# Patient Record
Sex: Female | Born: 1995 | Race: Black or African American | Hispanic: No | Marital: Single | Smoking: Former smoker
Health system: Southern US, Community
[De-identification: ages and names within clinical notes are randomized; demographics above are authoritative.]

## PROBLEM LIST (undated history)

## (undated) ENCOUNTER — Inpatient Hospital Stay: Payer: Self-pay

## (undated) DIAGNOSIS — R87629 Unspecified abnormal cytological findings in specimens from vagina: Secondary | ICD-10-CM

## (undated) DIAGNOSIS — D649 Anemia, unspecified: Secondary | ICD-10-CM

## (undated) DIAGNOSIS — F909 Attention-deficit hyperactivity disorder, unspecified type: Secondary | ICD-10-CM

## (undated) DIAGNOSIS — D573 Sickle-cell trait: Secondary | ICD-10-CM

## (undated) DIAGNOSIS — Z91410 Personal history of adult physical and sexual abuse: Secondary | ICD-10-CM

## (undated) DIAGNOSIS — Z789 Other specified health status: Secondary | ICD-10-CM

## (undated) DIAGNOSIS — Z8742 Personal history of other diseases of the female genital tract: Secondary | ICD-10-CM

## (undated) HISTORY — DX: Anemia, unspecified: D64.9

## (undated) HISTORY — DX: Unspecified abnormal cytological findings in specimens from vagina: R87.629

## (undated) HISTORY — PX: OTHER SURGICAL HISTORY: SHX169

## (undated) HISTORY — DX: Personal history of adult physical and sexual abuse: Z91.410

## (undated) HISTORY — DX: Attention-deficit hyperactivity disorder, unspecified type: F90.9

## (undated) HISTORY — DX: Personal history of other diseases of the female genital tract: Z87.42

---
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
## 2014-06-11 ENCOUNTER — Encounter: Payer: Self-pay | Admitting: *Deleted

## 2014-06-11 ENCOUNTER — Emergency Department
Admission: EM | Admit: 2014-06-11 | Discharge: 2014-06-11 | Disposition: A | Discharge status: Home / Self Care | Payer: Self-pay | Attending: Emergency Medicine | Admitting: Emergency Medicine

## 2014-06-11 DIAGNOSIS — Z720 Tobacco use: Secondary | ICD-10-CM | POA: Insufficient documentation

## 2014-06-11 DIAGNOSIS — W57XXXA Bitten or stung by nonvenomous insect and other nonvenomous arthropods, initial encounter: Secondary | ICD-10-CM | POA: Insufficient documentation

## 2014-06-11 DIAGNOSIS — Y9389 Activity, other specified: Secondary | ICD-10-CM | POA: Insufficient documentation

## 2014-06-11 DIAGNOSIS — Y9289 Other specified places as the place of occurrence of the external cause: Secondary | ICD-10-CM | POA: Insufficient documentation

## 2014-06-11 DIAGNOSIS — Y998 Other external cause status: Secondary | ICD-10-CM | POA: Insufficient documentation

## 2014-06-11 DIAGNOSIS — S90464A Insect bite (nonvenomous), right lesser toe(s), initial encounter: Secondary | ICD-10-CM | POA: Insufficient documentation

## 2014-06-11 NOTE — ED Provider Notes (Signed)
[facility] Emergency Department Provider Note  ____________________________________________  Time seen: 7:40 AM  I have reviewed the triage vital signs and the nursing notes.   HISTORY  Chief Complaint Foot Swelling     HPI Rose Porter is a 19 y.o. female who complains of intermittent swelling of right foot for one month which she attributes to a bug bite on her right third toe. She is not currently having any swelling nor pain but she believes that she may have a blood clot in her foot. No history of blood clots and again no physical complaints today.    History reviewed. No pertinent past medical history.  There are no active problems to display for this patient.   History reviewed. No pertinent past surgical history.  No current outpatient prescriptions on file.  Allergies Review of patient's allergies indicates no known allergies.  History reviewed. No pertinent family history.  Social History History  Substance Use Topics  . Smoking status: Current Every Day Smoker -- 1.00 packs/day    Types: Cigarettes  . Smokeless tobacco: Never Used  . Alcohol Use: Yes     Comment: daily, 1 pint of liquor    Review of Systems  Constitutional: Negative for fever.  Gastrointestinal: Negative for abdominal pain, vomiting and diarrhea. Genitourinary: Negative for dysuria. Musculoskeletal: Negative for back pain. Skin: Negative for rash. Neurological: Negative for headaches, focal weakness or numbness.   10-point ROS otherwise negative.  ____________________________________________   PHYSICAL EXAM:  VITAL SIGNS: ED Triage Vitals  Enc Vitals Group     BP 06/11/14 0639 124/74 mmHg     Pulse Rate 06/11/14 0639 67     Resp 06/11/14 0639 16     Temp 06/11/14 0639 98.1 F (36.7 C)     Temp Source 06/11/14 0639 Oral     SpO2 06/11/14 0639 100 %     Weight 06/11/14 0639 108 lb (48.988 kg)     Height 06/11/14 0639 5\' 1"  (1.549 m)   Head Cir --      Peak Flow --      Pain Score --      Pain Loc --      Pain Edu? --      Excl. in GC? --      Constitutional: Alert and oriented. Well appearing and in no distress.  Hematological/Lymphatic/Immunilogical: No cervical lymphadenopathy.  Genitourinary: deferred Musculoskeletal: Nontender with normal range of motion in all extremities. No joint effusions.  No lower extremity tenderness nor edema. Absolutely no swelling of feet. Normal pulses in both feet both feet are warm and well perfused. Neurologic:  Normal speech and language. No gross focal neurologic deficits are appreciated. Speech is normal.  Skin:  Skin is warm, dry and intact. No rash noted. Psychiatric: Mood and affect are normal. Speech and behavior are normal. Patient exhibits appropriate insight and judgment.  ____________________________________________    LABS (pertinent positives/negatives)  None  ____________________________________________   EKG  None  ____________________________________________    RADIOLOGY  None  ____________________________________________   PROCEDURES  Procedure(s) performed: None  Critical Care performed: None  ____________________________________________   INITIAL IMPRESSION / ASSESSMENT AND PLAN / ED COURSE  Pertinent labs & imaging results that were available during my care of the patient were reviewed by me and considered in my medical decision making (see chart for details).  No swelling of foot no areas of erythema or infection, no fungus noted. Foot is warm and well perfused. No calf pain or  swelling. Essentially benign exam. Reassurance provided. Recommend follow-up with PCP.  ____________________________________________   FINAL CLINICAL IMPRESSION(S) / ED DIAGNOSES  Final diagnoses:  Insect bite     Jene Everyobert Dyke Weible, MD 06/11/14 [phone]

## 2014-06-11 NOTE — ED Notes (Signed)
NAD noted at time of D/C. Pt denies questions or concerns. Pt ambulatory to the lobby at this time.  

## 2014-06-11 NOTE — ED Notes (Signed)
Pt c/o worsening condition of R foot. Pt endorses several year hx of R foot swelling. Pt states worsening swelling and itching of R foot starting x 1 week ago. Pt's foot is presently not swollen.

## 2014-09-05 ENCOUNTER — Emergency Department
Admission: EM | Admit: 2014-09-05 | Discharge: 2014-09-05 | Disposition: A | Discharge status: Home / Self Care | Payer: Self-pay | Attending: Emergency Medicine | Admitting: Emergency Medicine

## 2014-09-05 ENCOUNTER — Encounter: Payer: Self-pay | Admitting: Emergency Medicine

## 2014-09-05 DIAGNOSIS — B349 Viral infection, unspecified: Secondary | ICD-10-CM | POA: Insufficient documentation

## 2014-09-05 DIAGNOSIS — J029 Acute pharyngitis, unspecified: Secondary | ICD-10-CM | POA: Insufficient documentation

## 2014-09-05 DIAGNOSIS — Z720 Tobacco use: Secondary | ICD-10-CM | POA: Insufficient documentation

## 2014-09-05 LAB — POCT RAPID STREP A: STREPTOCOCCUS, GROUP A SCREEN (DIRECT): NEGATIVE

## 2014-09-05 MED ORDER — MAGIC MOUTHWASH
10.0000 mL | Freq: Once | ORAL | Status: DC
  Filled 2014-09-05 (×2): qty 10

## 2014-09-05 MED ORDER — PSEUDOEPH-BROMPHEN-DM 30-2-10 MG/5ML PO SYRP: 5.0000 mL | ORAL_SOLUTION | Freq: Four times a day (QID) | ORAL | Status: DC | PRN

## 2014-09-05 MED ORDER — IBUPROFEN 800 MG PO TABS
800.0000 mg | ORAL_TABLET | Freq: Once | ORAL | Status: AC
  Administered 2014-09-05: 800 mg via ORAL
  Filled 2014-09-05: qty 1

## 2014-09-05 MED ORDER — MAGIC MOUTHWASH W/LIDOCAINE: 5.0000 mL | Freq: Four times a day (QID) | ORAL | Status: DC

## 2014-09-05 NOTE — ED Provider Notes (Signed)
[facility] Emergency Department Provider Note  ____________________________________________  Time seen: Approximately 1:42 PM  I have reviewed the triage vital signs and the nursing notes.   HISTORY  Chief Complaint Fever and Sore Throat    HPI Rose Porter is a 19 y.o. female patient complaining of sore throat headache and fever   History reviewed. No pertinent past medical history.  There are no active problems to display for this patient.   History reviewed. No pertinent past surgical history.  Current Outpatient Rx  Name  Route  Sig  Dispense  Refill  . Alum & Mag Hydroxide-Simeth (MAGIC MOUTHWASH W/LIDOCAINE) SOLN   Oral   Take 5 mLs by mouth 4 (four) times daily.   100 mL   0   . brompheniramine-pseudoephedrine-DM 30-2-10 MG/5ML syrup   Oral   Take 5 mLs by mouth 4 (four) times daily as needed.   120 mL   0     Allergies Review of patient's allergies indicates no known allergies.  History reviewed. No pertinent family history.  Social History History  Substance Use Topics  . Smoking status: Current Every Day Smoker -- 1.00 packs/day    Types: Cigarettes  . Smokeless tobacco: Never Used  . Alcohol Use: Yes     Comment: daily, 1 pint of liquor    Review of Systems Constitutional: No fever/chills Eyes: No visual changes. ENT: No sore throat. Cardiovascular: Denies chest pain. Respiratory: Denies shortness of breath. Gastrointestinal: No abdominal pain.  No nausea, no vomiting.  No diarrhea.  No constipation. Genitourinary: Negative for dysuria. Musculoskeletal: Negative for back pain. Skin: Negative for rash. Neurological: Negative for headaches, focal weakness or numbness. 10-point ROS otherwise negative.  ____________________________________________   PHYSICAL EXAM:  VITAL SIGNS: ED Triage Vitals  Enc Vitals Group     BP 09/05/14 1255 122/77 mmHg     Pulse Rate 09/05/14 1255 110     Resp 09/05/14 1255  20     Temp 09/05/14 1255 100.1 F (37.8 C)     Temp Source 09/05/14 1255 Oral     SpO2 09/05/14 1255 97 %     Weight 09/05/14 1255 110 lb (49.896 kg)     Height 09/05/14 1255  (1.549 m)     Head Cir --      Peak Flow --      Pain Score 09/05/14 1256 7     Pain Loc --      Pain Edu? --      Excl. in GC? --     Constitutional: Alert and oriented. Well appearing and in no acute distress. Eyes: Conjunctivae are normal. PERRL. EOMI. Head: Atraumatic. Nose: No congestion/rhinnorhea. Mouth/Throat: Mucous membranes are moist.  Oropharynx non-erythematous. Neck: No stridor.  No cervical spine tenderness to palpation. Hematological/Lymphatic/Immunilogical: No cervical lymphadenopathy. Cardiovascular: Normal rate, regular rhythm. Grossly normal heart sounds.  Good peripheral circulation. Respiratory: Normal respiratory effort.  No retractions. Lungs CTAB. Gastrointestinal: Soft and nontender. No distention. No abdominal bruits. No CVA tenderness. Musculoskeletal: No lower extremity tenderness nor edema.  No joint effusions. Neurologic:  Normal speech and language. No gross focal neurologic deficits are appreciated. No gait instability. Skin:  Skin is warm, dry and intact. No rash noted. Psychiatric: Mood and affect are normal. Speech and behavior are normal.  ____________________________________________   LABS (all labs ordered are listed, but only abnormal results are displayed)  Labs Reviewed  POCT RAPID STREP A   ____________________________________________  EKG   ____________________________________________  RADIOLOGY   ____________________________________________   PROCEDURES  Procedure(s) performed: None  Critical Care performed: No  ____________________________________________   INITIAL IMPRESSION / ASSESSMENT AND PLAN / ED COURSE  Pertinent labs & imaging results that were available during my care of the patient were reviewed by me and considered in my  medical decision making (see chart for details).  Viral pharyngitis secondary to postnasal drainage. Patient given a prescription for ibuprofen, Magic mouthwash and also to take Phenergan DM as directed. [facility] condition condition worsens. ____________________________________________   FINAL CLINICAL IMPRESSION(S) / ED DIAGNOSES  Final diagnoses:  Pharyngitis with viral syndrome      Joni Reining, PA-C 09/05/14 1446  Phineas Semen, MD 09/05/14 [phone]

## 2014-09-05 NOTE — ED Notes (Signed)
Pt to ed with c/o sore throat and fever since last night.

## 2014-09-05 NOTE — ED Notes (Signed)
C/o sorethroat, headache, fever, back of throat slightly red

## 2015-01-28 DIAGNOSIS — Z8742 Personal history of other diseases of the female genital tract: Secondary | ICD-10-CM

## 2015-01-28 DIAGNOSIS — Z91410 Personal history of adult physical and sexual abuse: Secondary | ICD-10-CM

## 2015-01-28 HISTORY — DX: Personal history of other diseases of the female genital tract: Z87.42

## 2015-01-28 HISTORY — DX: Personal history of adult physical and sexual abuse: Z91.410

## 2015-04-11 LAB — OB RESULTS CONSOLE GBS: GBS: NEGATIVE

## 2015-04-11 LAB — OB RESULTS CONSOLE RPR: RPR: NONREACTIVE

## 2015-04-11 LAB — OB RESULTS CONSOLE GC/CHLAMYDIA
Chlamydia: NEGATIVE
Gonorrhea: NEGATIVE

## 2015-10-04 ENCOUNTER — Other Ambulatory Visit: Payer: Self-pay | Admitting: Advanced Practice Midwife

## 2015-10-04 DIAGNOSIS — Z369 Encounter for antenatal screening, unspecified: Secondary | ICD-10-CM

## 2015-10-04 LAB — OB RESULTS CONSOLE HEPATITIS B SURFACE ANTIGEN: Hepatitis B Surface Ag: NEGATIVE

## 2015-10-04 LAB — OB RESULTS CONSOLE HIV ANTIBODY (ROUTINE TESTING): HIV: NONREACTIVE

## 2015-10-25 ENCOUNTER — Encounter: Payer: Self-pay | Admitting: *Deleted

## 2015-10-25 ENCOUNTER — Ambulatory Visit (HOSPITAL_BASED_OUTPATIENT_CLINIC_OR_DEPARTMENT_OTHER)
Admission: RE | Admit: 2015-10-25 | Discharge: 2015-10-25 | Disposition: A | Discharge status: Home / Self Care | Payer: Medicaid Other | Source: Ambulatory Visit | Attending: Obstetrics & Gynecology | Admitting: Obstetrics & Gynecology

## 2015-10-25 ENCOUNTER — Ambulatory Visit
Admission: RE | Admit: 2015-10-25 | Discharge: 2015-10-25 | Disposition: A | Discharge status: Home / Self Care | Payer: Medicaid Other | Source: Ambulatory Visit | Attending: Obstetrics & Gynecology | Admitting: Obstetrics & Gynecology

## 2015-10-25 DIAGNOSIS — Z3A12 12 weeks gestation of pregnancy: Secondary | ICD-10-CM | POA: Diagnosis not present

## 2015-10-25 DIAGNOSIS — O99331 Smoking (tobacco) complicating pregnancy, first trimester: Secondary | ICD-10-CM | POA: Insufficient documentation

## 2015-10-25 DIAGNOSIS — F909 Attention-deficit hyperactivity disorder, unspecified type: Secondary | ICD-10-CM | POA: Diagnosis not present

## 2015-10-25 DIAGNOSIS — O0991 Supervision of high risk pregnancy, unspecified, first trimester: Secondary | ICD-10-CM | POA: Diagnosis not present

## 2015-10-25 DIAGNOSIS — Z369 Encounter for antenatal screening, unspecified: Secondary | ICD-10-CM

## 2015-10-25 DIAGNOSIS — Z36 Encounter for antenatal screening of mother: Secondary | ICD-10-CM

## 2015-10-25 DIAGNOSIS — O0990 Supervision of high risk pregnancy, unspecified, unspecified trimester: Secondary | ICD-10-CM

## 2015-10-25 DIAGNOSIS — Z91410 Personal history of adult physical and sexual abuse: Secondary | ICD-10-CM | POA: Insufficient documentation

## 2015-10-25 DIAGNOSIS — Z8659 Personal history of other mental and behavioral disorders: Secondary | ICD-10-CM | POA: Diagnosis not present

## 2015-10-25 HISTORY — DX: Sickle-cell trait: D57.3

## 2015-10-25 NOTE — Progress Notes (Signed)
Rose Porter, [place] A, MD

## 2015-10-25 NOTE — Progress Notes (Signed)
Rose Porter Length of Consultation: 45 minutes  Rose Porter  was referred to [facility] for genetic counseling to review prenatal screening and testing options.  This note summarizes the information we discussed.    We offered the following routine screening tests for this pregnancy:  First trimester screening, which includes nuchal translucency ultrasound screen and first trimester maternal serum marker screening.  The nuchal translucency has approximately an 80% detection rate for Down syndrome and can be positive for other chromosome abnormalities as well as congenital heart defects.  When combined with a maternal serum marker screening, the detection rate is up to 90% for Down syndrome and up to 97% for trisomy 18.     Maternal serum marker screening, a blood test that measures pregnancy proteins, can provide risk assessments for Down syndrome, trisomy 18, and open neural tube defects (spina bifida, anencephaly). Because it does not directly examine the fetus, it cannot positively diagnose or rule out these problems.  Targeted ultrasound uses high frequency sound waves to create an image of the developing fetus.  An ultrasound is often recommended as a routine means of evaluating the pregnancy.  It is also used to screen for fetal anatomy problems (for example, a heart defect) that might be suggestive of a chromosomal or other abnormality.   Should these screening tests indicate an increased concern, then the following additional testing options would be offered:  The chorionic villus sampling procedure is available for first trimester chromosome analysis.  This involves the withdrawal of a small amount of chorionic villi (tissue from the developing placenta).  Risk of pregnancy loss is estimated to be approximately 1 in 200 to 1 in 100 (0.5 to 1%).  There is approximately a 1% (1 in 100) chance that the CVS chromosome results will be unclear.  Chorionic villi cannot be  tested for neural tube defects.     Amniocentesis involves the removal of a small amount of amniotic fluid from the sac surrounding the fetus with the use of a thin needle inserted through the maternal abdomen and uterus.  Ultrasound guidance is used throughout the procedure.  Fetal cells from amniotic fluid are directly evaluated and > 99.5% of chromosome problems and > 98% of open neural tube defects can be detected. This procedure is generally performed after the 15th week of pregnancy.  The main risks to this procedure include complications leading to miscarriage in less than 1 in 200 cases (0.5%).  As another option for information if the pregnancy is suspected to be an an increased chance for certain chromosome conditions, we also reviewed the availability of cell free fetal DNA testing from maternal blood to determine whether or not the baby may have either Down syndrome, trisomy 77, or trisomy 50.  This test utilizes a maternal blood sample and DNA sequencing technology to isolate circulating cell free fetal DNA from maternal plasma.  The fetal DNA can then be analyzed for DNA sequences that are derived from the three most common chromosomes involved in aneuploidy, chromosomes 13, 18, and 21.  If the overall amount of DNA is greater than the expected level for any of these chromosomes, aneuploidy is suspected.  While we do not consider it a replacement for invasive testing and karyotype analysis, a negative result from this testing would be reassuring, though not a guarantee of a normal chromosome complement for the baby.  An abnormal result is certainly suggestive of an abnormal chromosome complement, though we would still recommend CVS  or amniocentesis to confirm any findings from this testing.  Cystic Fibrosis and Spinal Muscular Atrophy (SMA) screening were also discussed with the patient. Both conditions are recessive, which means that both parents must be carriers in order to have a child with  the disease.  Cystic fibrosis (CF) is one of the most common genetic conditions in persons of Caucasian ancestry.  This condition occurs in approximately 1 in 2,500 Caucasian persons and results in thickened secretions in the lungs, digestive, and reproductive systems.  For a baby to be at risk for having CF, both of the parents must be carriers for this condition.  Approximately 1 in 525 Caucasian persons is a carrier for CF.  Current carrier testing looks for the most common mutations in the gene for CF and can detect approximately 90% of carriers in the Caucasian population.  This means that the carrier screening can greatly reduce, but cannot eliminate, the chance for an individual to have a child with CF.  If an individual is found to be a carrier for CF, then carrier testing would be available for the partner. As part of [place]'s newborn screening profile, all babies born in the state of [place] will have a two-tier screening process.  Specimens are first tested to determine the concentration of immunoreactive trypsinogen (IRT).  The top 5% of specimens with the highest IRT values then undergo DNA testing using a panel of over 40 common CF mutations. SMA is a neurodegenerative disorder that leads to atrophy of skeletal muscle and overall weakness.  This condition is also more prevalent in the Caucasian population, with 1 in 40-1 in 60 persons being a carrier and 1 in 6,000-1 in 10,000 children being affected.  There are multiple forms of the disease, with some causing death in infancy to other forms with survival into adulthood.  The genetics of SMA is complex, but carrier screening can detect up to 95% of carriers in the Caucasian population.  Similar to CF, a negative result can greatly reduce, but cannot eliminate, the chance to have a child with SMA.  We obtained a detailed family history and pregnancy history.  The patient stated that she has sickle cell trait.  We also discussed that 1 in  10 individuals of African American ancestry have sickle cell trait.  We reviewed some general information about sickle cell disease and trait.  Sickle cell anemia is caused by a change in the hemoglobin.  Hemoglobin is the substance in red blood cells that carries oxygen.  When there is a change in the structure of the hemoglobin, there are problems in the way the blood carries oxygen.  People with sickle cell disease are at an increased risk for infections, stroke, damage to certain organs, painful crises and other medical complications.  Like cystic fibrosis, sickle cell disease is an autosomal recessive condition.  Ms. Roseanne RenoStewart stated that she does not know if the donor (or father of the baby) is a carrier and it is unlikely that he would be tested.  We discussed the option of free testing for sickle cell through the  Sickle Cell program and offered at every [facility] [place] [facility]  [place] health department.  Given the information provided today, there is a 1 in 40 chance of this fetus having sickle cell disease.  The remainder of the family history was reported to be unremarkable for birth defects, mental retardation, recurrent pregnancy loss or known chromosome abnormalities.  Consanguinity is denied.  Ms. Roseanne RenoStewart reported no pregnancy complications.  She is smoking approximately one cigarrette a day.  We encouraged her to reduce her tobacco intake altogether.  She also reported that she had several alcoholic drinks on a few occassions before she knew she was pregnant.  Based on reported amount, we would NOTsuspect that the fetus is at an increased risk for fetal alcohol syndrome.  After consideration of the options, Ms. Mcculley elected to proceed with first trimester screening and carrier screening for SMA.  An ultrasound was performed at the time of the visit.  The gestational age was consistent with 12 weeks.  Fetal anatomy could not be assessed due to early gestational age.  Please refer to the ultrasound  report for details of that study.  Ms. Masse was encouraged to call with questions or concerns.  We can be contacted at [phone].  TESTS ORDERED:   First Trimester screening    Carrier Screening, SMA

## 2015-10-26 LAB — MISC LABCORP TEST (SEND OUT): Labcorp test code: 450010

## 2015-10-29 ENCOUNTER — Telehealth: Payer: Self-pay | Admitting: Obstetrics and Gynecology

## 2015-10-29 NOTE — Telephone Encounter (Signed)
   Ms. Rose Porter elected to undergo First Trimester screening as a part of her routine prenatal care on 10/25/2015.  To review, first trimester screening, includes nuchal translucency ultrasound screen and/or first trimester maternal serum marker screening.  The nuchal translucency has approximately an 80% detection rate for Down syndrome and can be positive for other chromosome abnormalities as well as heart defects.  When combined with a maternal serum marker screening, the detection rate is up to 90% for Down syndrome and up to 97% for trisomy 13 and 18.     The results of the First Trimester Nuchal Translucency and Biochemical Screening were within normal range for aneuploidy.  The risk for Down syndrome is now estimated to be less than 1 in 10,000.  The risk for Trisomy 13/18 is also esimated to be less than 1 in 10,000.  Should more definitive information be desired, we would offer amniocentesis.  Because we do not yet know the effectiveness of combined first and second trimester screening, we do not recommend a maternal serum screen to assess the chance for chromosome conditions.  However, if screening for neural tube defects is desired, maternal serum screening for AFP only can be performed between 15 and [redacted] weeks gestation.    Of note, the PAPP-A level was at the 5th percentile.  PAPP-A results at or below the 5th percentile have been associated with and increased chance for adverse obstetrical outcomes, including preeclampsia.  For this reason, a third trimester ultrasound for growth is recommended.    Rose Andersoneborah F. Anzel Kearse, MS, CGC

## 2015-11-03 ENCOUNTER — Encounter: Payer: Self-pay | Admitting: *Deleted

## 2015-11-03 ENCOUNTER — Emergency Department
Admission: EM | Admit: 2015-11-03 | Discharge: 2015-11-03 | Disposition: A | Discharge status: Home / Self Care | Payer: Medicaid Other | Attending: Student | Admitting: Student

## 2015-11-03 DIAGNOSIS — O99331 Smoking (tobacco) complicating pregnancy, first trimester: Secondary | ICD-10-CM | POA: Diagnosis not present

## 2015-11-03 DIAGNOSIS — O4690 Antepartum hemorrhage, unspecified, unspecified trimester: Secondary | ICD-10-CM

## 2015-11-03 DIAGNOSIS — Z3A12 12 weeks gestation of pregnancy: Secondary | ICD-10-CM | POA: Insufficient documentation

## 2015-11-03 DIAGNOSIS — O200 Threatened abortion: Secondary | ICD-10-CM | POA: Diagnosis not present

## 2015-11-03 DIAGNOSIS — F17210 Nicotine dependence, cigarettes, uncomplicated: Secondary | ICD-10-CM | POA: Diagnosis not present

## 2015-11-03 DIAGNOSIS — O209 Hemorrhage in early pregnancy, unspecified: Secondary | ICD-10-CM | POA: Diagnosis present

## 2015-11-03 LAB — URINALYSIS COMPLETE WITH MICROSCOPIC (ARMC ONLY)
BILIRUBIN URINE: NEGATIVE
Bacteria, UA: NONE SEEN
GLUCOSE, UA: NEGATIVE mg/dL
HGB URINE DIPSTICK: NEGATIVE
KETONES UR: NEGATIVE mg/dL
LEUKOCYTES UA: NEGATIVE
NITRITE: NEGATIVE
Protein, ur: NEGATIVE mg/dL
SPECIFIC GRAVITY, URINE: 1.018 (ref 1.005–1.030)
pH: 5 (ref 5.0–8.0)

## 2015-11-03 LAB — CBC
HCT: 37.9 % (ref 35.0–47.0)
Hemoglobin: 12.8 g/dL (ref 12.0–16.0)
MCH: 30.7 pg (ref 26.0–34.0)
MCHC: 33.7 g/dL (ref 32.0–36.0)
MCV: 91.1 fL (ref 80.0–100.0)
PLATELETS: 204 10*3/uL (ref 150–440)
RBC: 4.16 MIL/uL (ref 3.80–5.20)
RDW: 12.5 % (ref 11.5–14.5)
WBC: 14.1 10*3/uL — AB (ref 3.6–11.0)

## 2015-11-03 LAB — COMPREHENSIVE METABOLIC PANEL
ALK PHOS: 52 U/L (ref 38–126)
ALT: 14 U/L (ref 14–54)
ANION GAP: 5 (ref 5–15)
AST: 18 U/L (ref 15–41)
Albumin: 3.7 g/dL (ref 3.5–5.0)
BILIRUBIN TOTAL: 0.2 mg/dL — AB (ref 0.3–1.2)
BUN: 8 mg/dL (ref 6–20)
CO2: 24 mmol/L (ref 22–32)
Calcium: 9.2 mg/dL (ref 8.9–10.3)
Chloride: 105 mmol/L (ref 101–111)
Creatinine, Ser: 0.67 mg/dL (ref 0.44–1.00)
Glucose, Bld: 91 mg/dL (ref 65–99)
Potassium: 3.4 mmol/L — ABNORMAL LOW (ref 3.5–5.1)
Sodium: 134 mmol/L — ABNORMAL LOW (ref 135–145)
Total Protein: 7.3 g/dL (ref 6.5–8.1)

## 2015-11-03 LAB — WET PREP, GENITAL
Sperm: NONE SEEN
Trich, Wet Prep: NONE SEEN
YEAST WET PREP: NONE SEEN

## 2015-11-03 LAB — CHLAMYDIA/NGC RT PCR (ARMC ONLY)
CHLAMYDIA TR: NOT DETECTED
N GONORRHOEAE: NOT DETECTED

## 2015-11-03 LAB — HCG, QUANTITATIVE, PREGNANCY: hCG, Beta Chain, Quant, S: 80694 m[IU]/mL — ABNORMAL HIGH (ref <5)

## 2015-11-03 LAB — ABO/RH: ABO/RH(D): B POS

## 2015-11-03 NOTE — ED Notes (Signed)
Reviewed d/c instructions and follow-up care with pt. Pt verbalized understanding 

## 2015-11-03 NOTE — ED Provider Notes (Addendum)
[facility] Emergency Department Provider Note   ____________________________________________   First MD Initiated Contact with Patient 11/03/15 1842     (approximate)  I have reviewed the triage vital signs and the nursing notes.   HISTORY  Chief Complaint Vaginal Bleeding    HPI Rose Porter is a 20 y.o. female with sickle cell trait, no other chronic medical problems, G1P0 at approximately 12 weeks estimated gestational age by first trimester ultrasound who presents for evaluation of light vaginal spotting this afternoon as well as mild lower abdominal cramping, gradual onset, mild, constant, no modifying factors. She reports she noted blood from her vagina but "only when I wiped". She denies any nausea vomiting diarrhea, fevers or chills. No chest pain or difficulty breathing. She last had intercourse yesterday evening. She was treated at the beginning of this pregnancy for gonorrhea and chlamydia. She is followed by [facility]. She denies any abnormal vaginal discharge.   Past Medical History:  Diagnosis Date  . Sickle cell trait [facility])     Patient Active Problem List   Diagnosis Date Noted  . First trimester screening 10/25/2015    History reviewed. No pertinent surgical history.  Prior to Admission medications   Medication Sig Start Date End Date Taking? Authorizing Provider  Alum & Mag Hydroxide-Simeth (MAGIC MOUTHWASH W/LIDOCAINE) SOLN Take 5 mLs by mouth 4 (four) times daily. Patient not taking: Reported on 10/25/2015 09/05/14   Joni Reining, PA-C  brompheniramine-pseudoephedrine-DM 30-2-10 MG/5ML syrup Take 5 mLs by mouth 4 (four) times daily as needed. Patient not taking: Reported on 10/25/2015 09/05/14   Joni Reining, PA-C    Allergies Review of patient's allergies indicates no known allergies.  History reviewed. No pertinent family history.  Social History Social History  Substance Use Topics  . Smoking status: Current  Every Day Smoker    Packs/day: 1.00    Types: Cigarettes  . Smokeless tobacco: Never Used  . Alcohol use Yes     Comment: daily, 1 pint of liquor    Review of Systems Constitutional: No fever/chills Eyes: No visual changes. ENT: No sore throat. Cardiovascular: Denies chest pain. Respiratory: Denies shortness of breath. Gastrointestinal: + abdominal cramping.  No nausea, no vomiting.  No diarrhea.  No constipation. Genitourinary: Negative for dysuria. Musculoskeletal: Negative for back pain. Skin: Negative for rash. Neurological: Negative for headaches, focal weakness or numbness.  10-point ROS otherwise negative.  ____________________________________________   PHYSICAL EXAM:  Vitals:   11/03/15 1709 11/03/15 2028  BP: 103/64 103/72  Pulse: 64 68  Resp: 18 16  Temp: 98.1 F (36.7 C) 97.6 F (36.4 C)  TempSrc: Oral Oral  SpO2: 100% 98%  Weight: 110 lb (49.9 kg)   Height: 5\' 1"  (1.549 m)     VITAL SIGNS: ED Triage Vitals [11/03/15 1709]  Enc Vitals Group     BP 103/64     Pulse Rate 64     Resp 18     Temp 98.1 F (36.7 C)     Temp Source Oral     SpO2 100 %     Weight 110 lb (49.9 kg)     Height 5\' 1"  (1.549 m)     Head Circumference      Peak Flow      Pain Score      Pain Loc      Pain Edu?      Excl. in GC?     Constitutional: Alert and oriented. Well appearing  and in no acute distress. Eyes: Conjunctivae are normal. PERRL. EOMI. Head: Atraumatic. Nose: No congestion/rhinnorhea. Mouth/Throat: Mucous membranes are moist.  Oropharynx non-erythematous. Neck: No stridor.   Cardiovascular: Normal rate, regular rhythm. Grossly normal heart sounds.  Good peripheral circulation. Respiratory: Normal respiratory effort.  No retractions. Lungs CTAB. Gastrointestinal: Soft and nontender. No distention. Bowel sounds normal. No CVA tenderness. Pelvic: No blood in the vaginal vault, cervix appears friable, os is closed, small amount of thin whitish yellow  vaginal discharge. No bimanual or cervical motion tenderness.  Musculoskeletal: No lower extremity tenderness nor edema.  No joint effusions. Neurologic:  Normal speech and language. No gross focal neurologic deficits are appreciated. No gait instability. Skin:  Skin is warm, dry and intact. No rash noted. Psychiatric: Mood and affect are normal. Speech and behavior are normal.  ____________________________________________   LABS (all labs ordered are listed, but only abnormal results are displayed)  Labs Reviewed  WET PREP, GENITAL - Abnormal; Notable for the following:       Result Value   Clue Cells Wet Prep HPF POC PRESENT (*)    WBC, Wet Prep HPF POC MODERATE (*)    All other components within normal limits  HCG, QUANTITATIVE, PREGNANCY - Abnormal; Notable for the following:    hCG, Beta Chain, Quant, S 80,694 (*)    All other components within normal limits  CBC - Abnormal; Notable for the following:    WBC 14.1 (*)    All other components within normal limits  URINALYSIS COMPLETEWITH MICROSCOPIC ([facility] ONLY) - Abnormal; Notable for the following:    Color, Urine YELLOW (*)    APPearance CLEAR (*)    Squamous Epithelial / LPF 0-5 (*)    All other components within normal limits  COMPREHENSIVE METABOLIC PANEL - Abnormal; Notable for the following:    Sodium 134 (*)    Potassium 3.4 (*)    Total Bilirubin 0.2 (*)    All other components within normal limits  CHLAMYDIA/NGC RT PCR ([facility] ONLY)  ABO/RH   ____________________________________________  EKG  none ____________________________________________  RADIOLOGY  Limited transabdominal emergency department bedside ultrasound performed by me shows a single live intrauterine pregnancy with fetal heart rate of 188 bpm. ____________________________________________   PROCEDURES  Procedure(s) performed: None  Procedures  Critical Care performed:  No  ____________________________________________   INITIAL IMPRESSION / ASSESSMENT AND PLAN / ED COURSE  Pertinent labs & imaging results that were available during my care of the patient were reviewed by me and considered in my medical decision making (see chart for details).  Rose Porter is a 20 y.o. female with sickle cell trait, no other chronic medical problems, G1P0 at approximately 12 weeks estimated gestational age by first trimester ultrasound who presents for evaluation of light vaginal spotting this afternoon as well as mild lower abdominal cramping. On exam, she is very well-appearing and in no acute distress. Vital signs stable, she is afebrile. She has a benign abdominal exam. Her axis friable but also is closed and there is no active vaginal bleeding. Discussed with this patient that this may represent a threatened miscarriage. CBC shows a nonspecific leukocytosis, white blood cell count is 14.1. Unremarkable CMP. Urinalysis is not consistent with infection. Wet prep with clue cells however patient is asymptomatic, no indication for treatment. Beta hCG was elevated at greater than 80,000, B positive blood type, no indication for RhoGAM. We discussed return precautions, need for close OB/GYN follow-up and she is comfortable with the discharge plan.  DC home. GC chlamydia pending at the time of discharge.  Clinical Course     ____________________________________________   FINAL CLINICAL IMPRESSION(S) / ED DIAGNOSES  Final diagnoses:  Vaginal bleeding in pregnancy  Threatened miscarriage      NEW MEDICATIONS STARTED DURING THIS VISIT:  New Prescriptions   No medications on file     Note:  This document was prepared using Dragon voice recognition software and may include unintentional dictation errors.    Gayla DossEryka A Naarah Borgerding, MD 11/03/15 [id number]    Gayla DossEryka A Kripa Foskey, MD 11/03/15 2032

## 2015-11-03 NOTE — ED Notes (Signed)
Report called to Jenna, RN.

## 2015-11-03 NOTE — ED Triage Notes (Signed)
States vaginal bleeding that began today, states she is approx [redacted] weeks pregnant, denies any clots, pt awake and alert in no acute distress

## 2015-11-08 ENCOUNTER — Telehealth: Payer: Self-pay | Admitting: Obstetrics and Gynecology

## 2015-11-08 NOTE — Telephone Encounter (Signed)
The results of the SMA carrier screening are now available.  SMA is also a recessive genetic condition with variable age of onset and severity caused by mutations in the SMN1 gene.  This carrier testing assesses the number of copies of this gene.  Persons with one copy of the SMN1 gene are carriers, and those with no copies are affected with the condition.  Individuals with two or more copies have a reduced chance to be a carrier.  Not all mutations can be detected with this testing, though it can detect 94.8% of carriers in the Caucasian population and 70.5% of mutations in the African American population.  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    Given these results and the ethnic background of the father of the pregnancy, the chance for him to be a carrier for SMA is 1 in 4972, which means that there is a 1 in 288 chance for this pregnancy to be affected with SMA.  If he chooses to have carrier testing, we can better assess these chances.  If he has negative results on carrier testing, that can decrease the chance that the baby would have the condition.  If he were found to be a carrier, then the chance for this baby to be affected with SMA is 1 in 4 and prenatal diagnosis would be available for this pregnancy through amniocentesis.  Ms. Rose Porter plans to speak with the father of the baby and contact our office to let us know if he would like to come in for testing.  We are happy to order the hemoglobinopathy evaluation for sickle cell testing too, as the patient is a carrier for sickle cell.  He previously declined carrier testing for sickle cell.  We encouraged the patient to call with any questions or concerns as they arise.  We may be reached at [phone].  Cherly Andersoneborah F. Chai Routh, MS, CGC

## 2015-12-06 LAB — OB RESULTS CONSOLE VARICELLA ZOSTER ANTIBODY, IGG: VARICELLA IGG: IMMUNE

## 2015-12-06 LAB — OB RESULTS CONSOLE RUBELLA ANTIBODY, IGM: RUBELLA: IMMUNE

## 2015-12-10 ENCOUNTER — Other Ambulatory Visit: Payer: Self-pay | Admitting: *Deleted

## 2015-12-10 DIAGNOSIS — O99332 Smoking (tobacco) complicating pregnancy, second trimester: Secondary | ICD-10-CM

## 2015-12-13 ENCOUNTER — Ambulatory Visit
Admission: RE | Admit: 2015-12-13 | Discharge: 2015-12-13 | Disposition: A | Discharge status: Home / Self Care | Payer: Medicaid Other | Source: Ambulatory Visit | Attending: Obstetrics and Gynecology | Admitting: Obstetrics and Gynecology

## 2015-12-13 DIAGNOSIS — O280 Abnormal hematological finding on antenatal screening of mother: Secondary | ICD-10-CM

## 2015-12-13 DIAGNOSIS — O09899 Supervision of other high risk pregnancies, unspecified trimester: Secondary | ICD-10-CM | POA: Insufficient documentation

## 2015-12-13 DIAGNOSIS — O99332 Smoking (tobacco) complicating pregnancy, second trimester: Secondary | ICD-10-CM | POA: Diagnosis not present

## 2015-12-13 DIAGNOSIS — Z3A19 19 weeks gestation of pregnancy: Secondary | ICD-10-CM | POA: Insufficient documentation

## 2016-01-28 NOTE — L&D Delivery Note (Addendum)
Delivery Note At 9:52 AM a viable and healthy female "Rose Porter" was delivered via Vaginal, Spontaneous Delivery (Presentation: LOA).  APGAR: 8, 9; weight 6 lb 15.5 oz (3160 g).   Placenta status: spontaneous.  Cord:  3vc with the following complications: none.  Cord pH: 7.23  Anesthesia:  epidural Episiotomy: None Lacerations: 1st degree Suture Repair: 3.0 vicryl Est. Blood Loss (mL):  400  Mom to postpartum.  Baby to Couplet care / Skin to Skin.  21 year old G1 P0 at [redacted]w[redacted]d on day 2 of an induction of labor for suspected fetal growth restriction and known low Pap A at first trimester screening. She received Pitocin, cervical ripening and artificial rupture membranes. She entered the second stage of labor which point she began to push over an intact perineum. She easily delivered the fetal head with a loose nuchal cord that was reduced on the perineum, followed promptly by the anterior shoulder in an LOA position. The baby was passed up to the maternal abdomen, and delayed cord clamping undertaken. He promptly began to cry, with excellent tone. The placenta delivered spontaneously with expression and was intact. A three-vessel cord was noted. Significant atony was noted, with brisk bright red bleeding immediately after delivery. Manual evacuation of the uterus revealed no clots, but atony to the fundus. Postpartum Pitocin was started without immediate resolution. after 1 minute, IM Methergine 0.2 mg was given. The uterus did contract down with bimanual massage, which was continued until her bleeding was minimal. The placenta was examined. The baby was vigorous and crying after a short stent in the warmer, and placed back on the maternal abdomen. cord gases were collected because of the suspected IUGR and minimal variability on fetal monitoring during labor. The placenta will be sent to pathology for the same reason.  The perineum was inspected, and a first-degree laceration along the left side was noted.  There was a skinning laceration in the posterior fourchette, and was repaired with 3-0 Vicryl on an SH needle for excellent reapproximation of the tissue.  The patient tolerated these procedures well, and is stable in the LDR.  [name] 05/03/2016, 10:58 AM

## 2016-02-21 ENCOUNTER — Ambulatory Visit
Admission: RE | Admit: 2016-02-21 | Discharge: 2016-02-21 | Disposition: A | Discharge status: Home / Self Care | Payer: Medicaid Other | Source: Ambulatory Visit | Attending: Obstetrics and Gynecology | Admitting: Obstetrics and Gynecology

## 2016-02-21 DIAGNOSIS — O09899 Supervision of other high risk pregnancies, unspecified trimester: Secondary | ICD-10-CM | POA: Insufficient documentation

## 2016-02-21 DIAGNOSIS — Z3A29 29 weeks gestation of pregnancy: Secondary | ICD-10-CM | POA: Diagnosis not present

## 2016-02-21 DIAGNOSIS — O280 Abnormal hematological finding on antenatal screening of mother: Secondary | ICD-10-CM | POA: Insufficient documentation

## 2016-03-28 ENCOUNTER — Encounter: Payer: Self-pay | Admitting: *Deleted

## 2016-03-28 ENCOUNTER — Inpatient Hospital Stay
Admission: EM | Admit: 2016-03-28 | Discharge: 2016-03-28 | Disposition: A | Discharge status: Home / Self Care | Payer: Medicaid Other | Attending: Obstetrics and Gynecology | Admitting: Obstetrics and Gynecology

## 2016-03-28 DIAGNOSIS — Z3A34 34 weeks gestation of pregnancy: Secondary | ICD-10-CM | POA: Diagnosis present

## 2016-03-28 DIAGNOSIS — O09893 Supervision of other high risk pregnancies, third trimester: Secondary | ICD-10-CM | POA: Insufficient documentation

## 2016-03-28 DIAGNOSIS — O280 Abnormal hematological finding on antenatal screening of mother: Secondary | ICD-10-CM

## 2016-03-28 DIAGNOSIS — O09899 Supervision of other high risk pregnancies, unspecified trimester: Secondary | ICD-10-CM

## 2016-03-28 NOTE — OB Triage Note (Signed)
Patient arrived for a Nonstress test.

## 2016-03-28 NOTE — Progress Notes (Signed)
   Triage visit for NST   Rose Porter is a 21 y.o. G1P0000. She is at 4559w1d gestation. She presents for a scheduled NST.  Indication: High risk pregnancy with Low PAPP-A  S: Resting comfortably. no CTX, no VB. Active fetal movement.   BP 105/61 (BP Location: Right Arm)   Pulse 72   Temp 97.5 F (36.4 C) (Oral)   Resp 16   LMP 08/02/2015 (LMP Unknown)  No results found for this or any previous visit (from the past 48 hour(s)).   Gen: NAD, AAOx3      Abd: FNTTP      Ext: Non-tender, Nonedmeatous    NST  Baseline: 140 bpm Variability: moderate Accelerations present x >2 Decelerations absent Time 20mins  Interpretation: reactive NST, category 1 tracing  A/P:  21 y.o. G1P0000 3759w1d with Low PAPP-A.    Reactive NST, with moderate variability and accelerations, no decels  Fetal Wellbeing: Reassuring  Strict FKC's daily  Growth US today at the office   D/c home stable, precautions reviewed, follow-up as scheduled.     [name], CNM

## 2016-03-28 NOTE — OB Triage Note (Signed)
G1P0 presents at 7161w1d for scheduled NST.

## 2016-03-28 NOTE — Discharge Instructions (Signed)
Drink plenty of fluids, get plenty of rest.  Keep all scheduled appointments.  Call your provider with question or concerns.

## 2016-03-28 NOTE — OB Triage Note (Signed)
[name], CNM notified of reactive NST.  Patient removed from monitors and discharged home.

## 2016-03-28 NOTE — OB Triage Note (Signed)
Pt discharged home.  Discharge instructions,  follow up appointment given to and reviewed with pt.  Pt verbalized understanding. Pt ambulatory.

## 2016-04-07 ENCOUNTER — Observation Stay
Admission: EM | Admit: 2016-04-07 | Discharge: 2016-04-07 | Disposition: A | Discharge status: Home / Self Care | Payer: Medicaid Other | Attending: Obstetrics & Gynecology | Admitting: Obstetrics & Gynecology

## 2016-04-07 DIAGNOSIS — O281 Abnormal biochemical finding on antenatal screening of mother: Principal | ICD-10-CM | POA: Insufficient documentation

## 2016-04-07 DIAGNOSIS — Z79899 Other long term (current) drug therapy: Secondary | ICD-10-CM | POA: Insufficient documentation

## 2016-04-07 DIAGNOSIS — Z87891 Personal history of nicotine dependence: Secondary | ICD-10-CM | POA: Diagnosis not present

## 2016-04-07 DIAGNOSIS — D573 Sickle-cell trait: Secondary | ICD-10-CM | POA: Insufficient documentation

## 2016-04-07 DIAGNOSIS — O99013 Anemia complicating pregnancy, third trimester: Secondary | ICD-10-CM | POA: Insufficient documentation

## 2016-04-07 DIAGNOSIS — O09899 Supervision of other high risk pregnancies, unspecified trimester: Secondary | ICD-10-CM

## 2016-04-07 DIAGNOSIS — Z3A35 35 weeks gestation of pregnancy: Secondary | ICD-10-CM | POA: Diagnosis not present

## 2016-04-07 DIAGNOSIS — Z3490 Encounter for supervision of normal pregnancy, unspecified, unspecified trimester: Secondary | ICD-10-CM

## 2016-04-07 DIAGNOSIS — O280 Abnormal hematological finding on antenatal screening of mother: Secondary | ICD-10-CM

## 2016-04-07 NOTE — OB Triage Note (Signed)
Pt here for NST from office. Pt placed on monitor. Will cont. To monitor.

## 2016-04-07 NOTE — Procedures (Signed)
NST  Patient's last menstrual period was 08/02/2015 . Estimated Date of Delivery: 05/08/16  Baseline: 135 Variability: moderate Accelerations present x >2 Decelerations absent Time 20mins  Interpretation: reactive NST, category 1 tracing  ----- Ranae Plumberhelsea Sathvika Ojo, MD Attending Obstetrician and Gynecologist [facility], Department of OB/GYN [facility]

## 2016-04-07 NOTE — Discharge Instructions (Signed)
Discharge instructions given. Patient here for NST. Reactive NST. Patient discharged home.

## 2016-04-07 NOTE — Discharge Summary (Signed)
Rose GarnetRoshonda D Porter is a 21 y.o. female. She is at 3061w4d gestation. Patient's last menstrual period was 08/02/2015 (lmp unknown). Estimated Date of Delivery: 05/08/16  Chief Complaint: high risk pregnancy  S: Resting comfortably. no CTX, no VB.no LOF,  Active fetal movement.   Patient has low PAPP-A, and is considered high risk for growth restriction and preeclampsia.  Maternal Medical History:   Past Medical History:  Diagnosis Date  . Sickle cell trait (HCC)     History reviewed. No pertinent surgical history.  No Known Allergies  Prior to Admission medications   Medication Sig Start Date End Date Taking? Authorizing Provider  Prenatal Vit-Fe Fumarate-FA (MULTIVITAMIN-PRENATAL) 27-0.8 MG TABS tablet Take 1 tablet by mouth daily at 12 noon.   Yes Historical Provider, MD  Alum & Mag Hydroxide-Simeth (MAGIC MOUTHWASH W/LIDOCAINE) SOLN Take 5 mLs by mouth 4 (four) times daily. Patient not taking: Reported on 12/13/2015 09/05/14   Joni Reiningonald K Smith, PA-C  brompheniramine-pseudoephedrine-DM 30-2-10 MG/5ML syrup Take 5 mLs by mouth 4 (four) times daily as needed. Patient not taking: Reported on 12/13/2015 09/05/14   Joni Reiningonald K Smith, PA-C     [facility]   Social History: She  reports that she has quit smoking. Her smoking use included Cigarettes. She smoked 1.00 pack per day. She has never used smokeless tobacco. She reports that she does not drink alcohol or use drugs.  Family History: family history includes Cancer in her cousin; Diabetes in her maternal grandfather, paternal grandfather, and paternal grandmother; Hypertension in her maternal grandmother. no history of gyn cancers  Review of Systems: A full review of systems was performed and negative except as noted in the HPI.     O:  BP 115/72   Pulse (!) 56   LMP 08/02/2015 (LMP Unknown)  No results found for this or any previous visit (from the past 48 hour(s)).   Constitutional: NAD, AAOx3  HE/ENT:  extraocular movements grossly intact, moist mucous membranes CV: RRR PULM: nl respiratory effort, CTABL     Abd: gravid, non-tender, non-distended, soft      Ext: Non-tender, Nonedmeatous   Psych: mood appropriate, speech normal Pelvic: deferred  FHT: 135 mod + accels no decels - see NST TOCO: quiet    A/P:  21yo G1P0 @ 35.2 with High Risk Pregnancy and Reactive NST   Labor: not present.   Fetal Wellbeing: Reassuring Cat 1 tracing.  D/c home stable, precautions reviewed, follow-up as scheduled.   ----- Ranae Plumberhelsea Ward, MD Attending Obstetrician and Gynecologist [facility], Department of OB[facility]GYN [facility]

## 2016-04-10 LAB — OB RESULTS CONSOLE RPR: RPR: NONREACTIVE

## 2016-04-10 LAB — OB RESULTS CONSOLE GC/CHLAMYDIA
Chlamydia: NEGATIVE
GC PROBE AMP, GENITAL: NEGATIVE

## 2016-04-10 LAB — OB RESULTS CONSOLE GBS: STREP GROUP B AG: NEGATIVE

## 2016-04-17 ENCOUNTER — Inpatient Hospital Stay
Admission: EM | Admit: 2016-04-17 | Discharge: 2016-04-17 | Disposition: A | Discharge status: Home / Self Care | Payer: Medicaid Other | Attending: Obstetrics and Gynecology | Admitting: Obstetrics and Gynecology

## 2016-04-17 ENCOUNTER — Encounter: Payer: Self-pay | Admitting: *Deleted

## 2016-04-17 DIAGNOSIS — O4703 False labor before 37 completed weeks of gestation, third trimester: Secondary | ICD-10-CM | POA: Insufficient documentation

## 2016-04-17 DIAGNOSIS — Z3A37 37 weeks gestation of pregnancy: Secondary | ICD-10-CM | POA: Diagnosis not present

## 2016-04-17 DIAGNOSIS — O280 Abnormal hematological finding on antenatal screening of mother: Secondary | ICD-10-CM

## 2016-04-17 DIAGNOSIS — O09899 Supervision of other high risk pregnancies, unspecified trimester: Secondary | ICD-10-CM

## 2016-04-17 HISTORY — DX: Other specified health status: Z78.9

## 2016-04-17 LAB — CBC
HEMATOCRIT: 34.3 % — AB (ref 35.0–47.0)
HEMOGLOBIN: 11.8 g/dL — AB (ref 12.0–16.0)
MCH: 31.3 pg (ref 26.0–34.0)
MCHC: 34.5 g/dL (ref 32.0–36.0)
MCV: 90.7 fL (ref 80.0–100.0)
Platelets: 208 10*3/uL (ref 150–440)
RBC: 3.78 MIL/uL — ABNORMAL LOW (ref 3.80–5.20)
RDW: 12.6 % (ref 11.5–14.5)
WBC: 13.4 10*3/uL — ABNORMAL HIGH (ref 3.6–11.0)

## 2016-04-17 LAB — URINE DRUG SCREEN, QUALITATIVE (ARMC ONLY)
Amphetamines, Ur Screen: NOT DETECTED
BARBITURATES, UR SCREEN: NOT DETECTED
BENZODIAZEPINE, UR SCRN: NOT DETECTED
COCAINE METABOLITE, UR ~~LOC~~: NOT DETECTED
Cannabinoid 50 Ng, Ur ~~LOC~~: POSITIVE — AB
MDMA (Ecstasy)Ur Screen: NOT DETECTED
METHADONE SCREEN, URINE: NOT DETECTED
OPIATE, UR SCREEN: NOT DETECTED
PHENCYCLIDINE (PCP) UR S: NOT DETECTED
Tricyclic, Ur Screen: NOT DETECTED

## 2016-04-17 LAB — URINALYSIS, ROUTINE W REFLEX MICROSCOPIC
Bilirubin Urine: NEGATIVE
GLUCOSE, UA: NEGATIVE mg/dL
HGB URINE DIPSTICK: NEGATIVE
Ketones, ur: NEGATIVE mg/dL
Nitrite: NEGATIVE
PROTEIN: NEGATIVE mg/dL
Specific Gravity, Urine: 1.005 (ref 1.005–1.030)
pH: 7 (ref 5.0–8.0)

## 2016-04-17 MED ORDER — CALCIUM CARBONATE ANTACID 500 MG PO CHEW: 2.0000 | CHEWABLE_TABLET | ORAL | Status: DC | PRN

## 2016-04-17 MED ORDER — LACTATED RINGERS IV BOLUS (SEPSIS)
500.0000 mL | Freq: Once | INTRAVENOUS | Status: AC
  Administered 2016-04-17: 500 mL via INTRAVENOUS

## 2016-04-17 MED ORDER — ACETAMINOPHEN 325 MG PO TABS
650.0000 mg | ORAL_TABLET | ORAL | Status: DC | PRN
Start: 2016-04-17 — End: 2016-04-17

## 2016-04-17 NOTE — Plan of Care (Signed)
Dr Dalbert Garnetbeasley in to see pt. Unable to check pt due pt c/o painful vaginal exam. [facility] . Pt d/c home with d/c instructions

## 2016-04-17 NOTE — Plan of Care (Signed)
Pt states she is feeling better. Waiting for dr Dalbert Garnetbeasley

## 2016-04-17 NOTE — OB Triage Note (Signed)
Pt reports onset of sharp, stabbing pains on both sides of uterus- reports occurs bilaterally, not necessarily at same time or length, note: states usually lasting approx a minute in length, unable to discern pattern of occurrence. Happening sometimes with fetal movement. Reports that top of uterus tender- started last evening, worsens with baby balling up. Reports baby active. Pt resting quietly, grimacing with fetal movement.

## 2016-04-17 NOTE — OB Triage Provider Note (Signed)
TRIAGE VISIT with NST   Rose Porter is a 21 y.o. G1P0000. She is at 5824w0d gestation, presenting with signs of labor.  Indication: Contractions  S: Resting comfortably. No obvious CTX when watching patient, though +on monitor, no VB. Active fetal movement. Concerned about generalized abdominal pain.  O:  BP 110/66   Pulse 70   Temp 98 F (36.7 C) (Oral)   Resp 16   Ht 5\' 1"  (1.549 m)   Wt 140 lb (63.5 kg)   LMP 08/02/2015 (LMP Unknown)   BMI 26.45 kg/m  No results found for this or any previous visit (from the past 48 hour(s)).   Gen: NAD, AAOx3      Abd: FNTTP      Ext: Non-tender, Nonedmeatous    NST/FHT: 145, mod var, +accels, no decels TOCO: q2-173min SVE:  attempted but not tolerated by patient and I was unable to get an internal os exam  NST: Reactive. See FHT above for particulars.  A/P:  21 y.o. G1P0000 1024w0d with contractions.   Labor: not present.   May have yeast infection. Will take to clinic for appointment that is already scheduled this hour, and will do wet mount.  Fetal Wellbeing: NST reactive Reassuring Cat 1 tracing.  D/c home stable, precautions reviewed, follow-up as scheduled.

## 2016-05-01 ENCOUNTER — Inpatient Hospital Stay
Admission: EM | Admit: 2016-05-01 | Discharge: 2016-05-04 | DRG: 775 | Disposition: A | Discharge status: Home / Self Care | Payer: Medicaid Other | Attending: Obstetrics and Gynecology | Admitting: Obstetrics and Gynecology

## 2016-05-01 DIAGNOSIS — O9902 Anemia complicating childbirth: Secondary | ICD-10-CM | POA: Diagnosis present

## 2016-05-01 DIAGNOSIS — Z3A39 39 weeks gestation of pregnancy: Secondary | ICD-10-CM

## 2016-05-01 DIAGNOSIS — D573 Sickle-cell trait: Secondary | ICD-10-CM | POA: Diagnosis present

## 2016-05-01 DIAGNOSIS — O365930 Maternal care for other known or suspected poor fetal growth, third trimester, not applicable or unspecified: Principal | ICD-10-CM | POA: Diagnosis present

## 2016-05-01 DIAGNOSIS — Z87891 Personal history of nicotine dependence: Secondary | ICD-10-CM

## 2016-05-01 DIAGNOSIS — Z3490 Encounter for supervision of normal pregnancy, unspecified, unspecified trimester: Secondary | ICD-10-CM

## 2016-05-01 DIAGNOSIS — O09899 Supervision of other high risk pregnancies, unspecified trimester: Secondary | ICD-10-CM

## 2016-05-01 DIAGNOSIS — O280 Abnormal hematological finding on antenatal screening of mother: Secondary | ICD-10-CM

## 2016-05-01 DIAGNOSIS — Z8249 Family history of ischemic heart disease and other diseases of the circulatory system: Secondary | ICD-10-CM

## 2016-05-01 LAB — CHLAMYDIA/NGC RT PCR (ARMC ONLY)
CHLAMYDIA TR: NOT DETECTED
N gonorrhoeae: NOT DETECTED

## 2016-05-01 LAB — TYPE AND SCREEN
ABO/RH(D): B POS
Antibody Screen: NEGATIVE

## 2016-05-01 LAB — URINE DRUG SCREEN, QUALITATIVE (ARMC ONLY)
Amphetamines, Ur Screen: NOT DETECTED
BARBITURATES, UR SCREEN: NOT DETECTED
Benzodiazepine, Ur Scrn: NOT DETECTED
CANNABINOID 50 NG, UR ~~LOC~~: POSITIVE — AB
COCAINE METABOLITE, UR ~~LOC~~: NOT DETECTED
MDMA (ECSTASY) UR SCREEN: NOT DETECTED
METHADONE SCREEN, URINE: NOT DETECTED
OPIATE, UR SCREEN: NOT DETECTED
Phencyclidine (PCP) Ur S: NOT DETECTED
Tricyclic, Ur Screen: NOT DETECTED

## 2016-05-01 LAB — CBC
HEMATOCRIT: 34.8 % — AB (ref 35.0–47.0)
HEMOGLOBIN: 11.9 g/dL — AB (ref 12.0–16.0)
MCH: 30.8 pg (ref 26.0–34.0)
MCHC: 34.1 g/dL (ref 32.0–36.0)
MCV: 90.3 fL (ref 80.0–100.0)
Platelets: 225 10*3/uL (ref 150–440)
RBC: 3.86 MIL/uL (ref 3.80–5.20)
RDW: 12.2 % (ref 11.5–14.5)
WBC: 9.3 10*3/uL (ref 3.6–11.0)

## 2016-05-01 MED ORDER — ONDANSETRON HCL 4 MG/2ML IJ SOLN
4.0000 mg | Freq: Four times a day (QID) | INTRAMUSCULAR | Status: DC | PRN
  Administered 2016-05-03: 4 mg via INTRAVENOUS
  Filled 2016-05-01: qty 2

## 2016-05-01 MED ORDER — ZOLPIDEM TARTRATE 5 MG PO TABS: 5.0000 mg | ORAL_TABLET | Freq: Every evening | ORAL | Status: DC | PRN

## 2016-05-01 MED ORDER — ACETAMINOPHEN 325 MG PO TABS: 650.0000 mg | ORAL_TABLET | ORAL | Status: DC | PRN

## 2016-05-01 MED ORDER — LIDOCAINE HCL (PF) 1 % IJ SOLN: 30.0000 mL | INTRAMUSCULAR | Status: DC | PRN

## 2016-05-01 MED ORDER — SOD CITRATE-CITRIC ACID 500-334 MG/5ML PO SOLN
30.0000 mL | ORAL | Status: DC | PRN
  Filled 2016-05-01: qty 30

## 2016-05-01 MED ORDER — BUTORPHANOL TARTRATE 1 MG/ML IJ SOLN: 2.0000 mg | INTRAMUSCULAR | Status: DC | PRN

## 2016-05-01 MED ORDER — TERBUTALINE SULFATE 1 MG/ML IJ SOLN: 0.2500 mg | Freq: Once | INTRAMUSCULAR | Status: DC | PRN

## 2016-05-01 MED ORDER — LACTATED RINGERS IV SOLN
INTRAVENOUS | Status: DC
  Administered 2016-05-01 – 2016-05-03 (×4): via INTRAVENOUS

## 2016-05-01 MED ORDER — LACTATED RINGERS IV SOLN: 500.0000 mL | INTRAVENOUS | Status: DC | PRN

## 2016-05-01 MED ORDER — OXYTOCIN BOLUS FROM INFUSION
500.0000 mL | Freq: Once | INTRAVENOUS | Status: AC
  Administered 2016-05-03: 500 mL via INTRAVENOUS

## 2016-05-01 MED ORDER — OXYTOCIN 40 UNITS IN LACTATED RINGERS INFUSION - SIMPLE MED
2.5000 [IU]/h | INTRAVENOUS | Status: DC
  Administered 2016-05-03: 2.5 [IU]/h via INTRAVENOUS

## 2016-05-01 MED ORDER — DINOPROSTONE 10 MG VA INST
10.0000 mg | VAGINAL_INSERT | Freq: Once | VAGINAL | Status: AC
  Administered 2016-05-01: 10 mg via VAGINAL
  Filled 2016-05-01: qty 1

## 2016-05-01 MED ORDER — BUTORPHANOL TARTRATE 1 MG/ML IJ SOLN
1.0000 mg | INTRAMUSCULAR | Status: DC | PRN
Start: 2016-05-01 — End: 2016-05-03
  Administered 2016-05-02: 1 mg via INTRAVENOUS
  Filled 2016-05-01: qty 1

## 2016-05-02 MED ORDER — MISOPROSTOL 25 MCG QUARTER TABLET
25.0000 ug | ORAL_TABLET | ORAL | Status: DC
  Administered 2016-05-02 (×3): 25 ug via ORAL
  Filled 2016-05-02 (×3): qty 1

## 2016-05-03 ENCOUNTER — Inpatient Hospital Stay: Payer: Medicaid Other | Admitting: Anesthesiology

## 2016-05-03 LAB — PLATELET COUNT: PLATELETS: 197 10*3/uL (ref 150–440)

## 2016-05-03 LAB — RPR: RPR Ser Ql: NONREACTIVE

## 2016-05-03 MED ORDER — DIBUCAINE 1 % RE OINT: 1.0000 "application " | TOPICAL_OINTMENT | RECTAL | Status: DC | PRN

## 2016-05-03 MED ORDER — ZOLPIDEM TARTRATE 5 MG PO TABS: 5.0000 mg | ORAL_TABLET | Freq: Every evening | ORAL | Status: DC | PRN

## 2016-05-03 MED ORDER — FENTANYL 2.5 MCG/ML W/ROPIVACAINE 0.2% IN NS 100 ML EPIDURAL INFUSION (ARMC-ANES)
EPIDURAL | Status: DC | PRN
  Administered 2016-05-03: 10 mL/h via EPIDURAL

## 2016-05-03 MED ORDER — ACETAMINOPHEN 325 MG PO TABS: 650.0000 mg | ORAL_TABLET | ORAL | Status: DC | PRN

## 2016-05-03 MED ORDER — DIPHENHYDRAMINE HCL 25 MG PO CAPS: 25.0000 mg | ORAL_CAPSULE | ORAL | Status: DC | PRN

## 2016-05-03 MED ORDER — OXYTOCIN 10 UNIT/ML IJ SOLN
INTRAMUSCULAR | Status: AC
  Filled 2016-05-03: qty 2

## 2016-05-03 MED ORDER — FLEET ENEMA 7-19 GM/118ML RE ENEM: 1.0000 | ENEMA | Freq: Every day | RECTAL | Status: DC | PRN

## 2016-05-03 MED ORDER — ONDANSETRON HCL 4 MG/2ML IJ SOLN: 4.0000 mg | Freq: Three times a day (TID) | INTRAMUSCULAR | Status: DC | PRN

## 2016-05-03 MED ORDER — MISOPROSTOL 200 MCG PO TABS
ORAL_TABLET | ORAL | Status: AC
  Filled 2016-05-03: qty 4

## 2016-05-03 MED ORDER — BISACODYL 10 MG RE SUPP: 10.0000 mg | Freq: Every day | RECTAL | Status: DC | PRN

## 2016-05-03 MED ORDER — ONDANSETRON HCL 4 MG/2ML IJ SOLN: 4.0000 mg | INTRAMUSCULAR | Status: DC | PRN

## 2016-05-03 MED ORDER — FENTANYL 2.5 MCG/ML W/ROPIVACAINE 0.2% IN NS 100 ML EPIDURAL INFUSION (ARMC-ANES): 10.0000 mL/h | EPIDURAL | Status: DC

## 2016-05-03 MED ORDER — METHYLERGONOVINE MALEATE 0.2 MG/ML IJ SOLN
INTRAMUSCULAR | Status: AC
  Filled 2016-05-03: qty 1

## 2016-05-03 MED ORDER — SENNOSIDES-DOCUSATE SODIUM 8.6-50 MG PO TABS: 2.0000 | ORAL_TABLET | ORAL | Status: DC

## 2016-05-03 MED ORDER — LIDOCAINE-EPINEPHRINE (PF) 1.5 %-1:200000 IJ SOLN
INTRAMUSCULAR | Status: DC | PRN
  Administered 2016-05-03: 3 mL

## 2016-05-03 MED ORDER — NALBUPHINE HCL 10 MG/ML IJ SOLN: 5.0000 mg | INTRAMUSCULAR | Status: DC | PRN

## 2016-05-03 MED ORDER — SODIUM CHLORIDE 0.9% FLUSH: 3.0000 mL | INTRAVENOUS | Status: DC | PRN

## 2016-05-03 MED ORDER — ONDANSETRON HCL 4 MG PO TABS: 4.0000 mg | ORAL_TABLET | ORAL | Status: DC | PRN

## 2016-05-03 MED ORDER — FENTANYL 2.5 MCG/ML W/ROPIVACAINE 0.2% IN NS 100 ML EPIDURAL INFUSION (ARMC-ANES)
EPIDURAL | Status: AC
  Filled 2016-05-03: qty 100

## 2016-05-03 MED ORDER — PRENATAL MULTIVITAMIN CH: 1.0000 | ORAL_TABLET | Freq: Every day | ORAL | Status: DC

## 2016-05-03 MED ORDER — DIPHENHYDRAMINE HCL 25 MG PO CAPS: 25.0000 mg | ORAL_CAPSULE | Freq: Four times a day (QID) | ORAL | Status: DC | PRN

## 2016-05-03 MED ORDER — MEASLES, MUMPS & RUBELLA VAC ~~LOC~~ INJ
0.5000 mL | INJECTION | Freq: Once | SUBCUTANEOUS | Status: DC
  Filled 2016-05-03: qty 0.5

## 2016-05-03 MED ORDER — WITCH HAZEL-GLYCERIN EX PADS: 1.0000 "application " | MEDICATED_PAD | CUTANEOUS | Status: DC | PRN

## 2016-05-03 MED ORDER — LIDOCAINE HCL (PF) 1 % IJ SOLN
INTRAMUSCULAR | Status: AC
  Filled 2016-05-03: qty 30

## 2016-05-03 MED ORDER — SIMETHICONE 80 MG PO CHEW: 80.0000 mg | CHEWABLE_TABLET | ORAL | Status: DC | PRN

## 2016-05-03 MED ORDER — SODIUM CHLORIDE 0.9% FLUSH: 3.0000 mL | Freq: Two times a day (BID) | INTRAVENOUS | Status: DC

## 2016-05-03 MED ORDER — SODIUM CHLORIDE 0.9 % IV SOLN: 250.0000 mL | INTRAVENOUS | Status: DC | PRN

## 2016-05-03 MED ORDER — NALOXONE HCL 2 MG/2ML IJ SOSY
1.0000 ug/kg/h | PREFILLED_SYRINGE | INTRAMUSCULAR | Status: DC | PRN
  Filled 2016-05-03: qty 2

## 2016-05-03 MED ORDER — BENZOCAINE-MENTHOL 20-0.5 % EX AERO: 1.0000 "application " | INHALATION_SPRAY | CUTANEOUS | Status: DC | PRN

## 2016-05-03 MED ORDER — METHYLERGONOVINE MALEATE 0.2 MG/ML IJ SOLN
0.2000 mg | Freq: Once | INTRAMUSCULAR | Status: AC
  Administered 2016-05-03: 10:00:00 via INTRAMUSCULAR

## 2016-05-03 MED ORDER — NALOXONE HCL 0.4 MG/ML IJ SOLN: 0.4000 mg | INTRAMUSCULAR | Status: DC | PRN

## 2016-05-03 MED ORDER — AMMONIA AROMATIC IN INHA
RESPIRATORY_TRACT | Status: AC
  Filled 2016-05-03: qty 10

## 2016-05-03 MED ORDER — NALBUPHINE HCL 10 MG/ML IJ SOLN: 5.0000 mg | Freq: Once | INTRAMUSCULAR | Status: DC | PRN

## 2016-05-03 MED ORDER — OXYTOCIN 40 UNITS IN LACTATED RINGERS INFUSION - SIMPLE MED
INTRAVENOUS | Status: AC
  Administered 2016-05-03: 10:00:00
  Filled 2016-05-03: qty 1000

## 2016-05-03 MED ORDER — TETANUS-DIPHTH-ACELL PERTUSSIS 5-2.5-18.5 LF-MCG/0.5 IM SUSP: 0.5000 mL | Freq: Once | INTRAMUSCULAR | Status: DC

## 2016-05-03 MED ORDER — DIPHENHYDRAMINE HCL 50 MG/ML IJ SOLN: 12.5000 mg | INTRAMUSCULAR | Status: DC | PRN

## 2016-05-03 MED ORDER — IBUPROFEN 600 MG PO TABS
600.0000 mg | ORAL_TABLET | Freq: Four times a day (QID) | ORAL | Status: DC
  Administered 2016-05-03 – 2016-05-04 (×4): 600 mg via ORAL
  Filled 2016-05-03 (×4): qty 1

## 2016-05-03 MED ORDER — SODIUM CHLORIDE 0.9 % IV SOLN
INTRAVENOUS | Status: DC | PRN
  Administered 2016-05-03 (×3): 5 mL via EPIDURAL

## 2016-05-03 MED ORDER — COCONUT OIL OIL: 1.0000 "application " | TOPICAL_OIL | Status: DC | PRN

## 2016-05-03 NOTE — Progress Notes (Signed)
Rose Porter is a 21 y.o. G1P0000 at [redacted]w[redacted]d, assuming care  Subjective: Complete and pushing, minimal variability with improvement with pushing  Objective: BP 100/61   Pulse (!) 50   Temp 97.9 F (36.6 C) (Oral)   Resp 18   Ht  (1.549 m)   Wt 141 lb (64 kg)   LMP 08/02/2015 (LMP Unknown)   SpO2 97%   BMI 26.64 kg/m  I/O last 3 completed shifts: In: 3753.3 [P.O.:120; I.V.:3633.3] Out: -  No intake/output data recorded.  FHT:  FHR: 120 bpm, variability: minimal ,  accelerations:  Abscent,  decelerations:  Present with pushing UC:   regular, every 5 minutes SVE:   Dilation: 10 Effacement (%): 100 Station: 0 Exam by:: Rose Porter   Labs: Lab Results  Component Value Date   WBC 9.3 05/01/2016   HGB 11.9 (L) 05/01/2016   HCT 34.8 (L) 05/01/2016   MCV 90.3 05/01/2016   PLT 197 05/03/2016    Assessment / Plan: 21yo g1P0 at 39+2wks with FGR and low PAPP-A, who is complete and pushing. Anticiapte NSVD  Rose Porter 05/03/2016, 9:17 AM

## 2016-05-03 NOTE — Progress Notes (Signed)
Intrapartum progress note   Overnight patient slept, no complaints.  Not feeling any ctx. Fetal heart tracing became persistently tachycardic to 170, and cervidil was pulled slightly earlier than 12hr mark.  No maternal fevers or fundal tenderness - subjectively or objectively.  Fetal heart tracing returned to Category 1 without intervention.  105/57 98.6 79 18  FHR 155 mod + accels no decels TOCO q2-3 min  SVE: 1/60/-1 posterior  A/P: 21yo G1P0 @ 39.1 with IOL for IUGR  1. IOL: cytotec started once persistently category 1 tracing returned, will do q4h until max 24hrs, cervical change, or >3ctx every . 2. IUP: category 1 now.  Continuous monitoring.   3. Continue active management. 4. Anticipate SVD.  ----- Ranae Plumber, MD Attending Obstetrician and Gynecologist [facility], Department of OB/GYN [facility]

## 2016-05-03 NOTE — Anesthesia Procedure Notes (Signed)
Epidural Patient location during procedure: OB Start time: 05/03/2016 12:57 AM End time: 05/03/2016 1:13 AM  Staffing Anesthesiologist: Alver Fisher Performed: anesthesiologist   Preanesthetic Checklist Completed: patient identified, site marked, surgical consent, pre-op evaluation, timeout performed, IV checked, risks and benefits discussed and monitors and equipment checked  Epidural Patient position: sitting Prep: ChloraPrep Patient monitoring: heart rate, continuous pulse ox and blood pressure Approach: midline Location: L4-L5 Injection technique: LOR saline  Needle:  Needle type: Tuohy  Needle gauge: 18 G Needle length: 9 cm and 9 Needle insertion depth: 4 cm Catheter type: closed end flexible Catheter size: 20 Guage Catheter at skin depth: 8 cm Test dose: negative (0.125% bupivacaine)  Assessment Events: blood not aspirated, injection not painful, no injection resistance, negative IV test and no paresthesia  Additional Notes   Patient tolerated the insertion well without complications.Reason for block:procedure for pain

## 2016-05-03 NOTE — Clinical Social Work Note (Signed)
CSW received consult that patient' was positive for marijuana. CSW will assess when able.  Rose Porter, MSW, Theresia Majors [phone]
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
## 2016-05-03 NOTE — Anesthesia Preprocedure Evaluation (Addendum)
Anesthesia Evaluation  Patient identified by MRN, date of birth, ID band Patient awake    Reviewed: Allergy & Precautions, NPO status , Patient's Chart, lab work & pertinent test results  History of Anesthesia Complications Negative for: history of anesthetic complications  Airway Mallampati: II  TM Distance: >3 FB Neck ROM: Full    Dental no notable dental hx.    Pulmonary neg sleep apnea, neg COPD, former smoker,    breath sounds clear to auscultation- rhonchi (-) wheezing      Cardiovascular Exercise Tolerance: Good (-) hypertension(-) CAD and (-) Past MI  Rhythm:Regular Rate:Normal - Systolic murmurs and - Diastolic murmurs    Neuro/Psych negative neurological ROS  negative psych ROS   GI/Hepatic negative GI ROS, Neg liver ROS,   Endo/Other  negative endocrine ROSneg diabetes  Renal/GU negative Renal ROS     Musculoskeletal negative musculoskeletal ROS (+)   Abdominal Gravid abdomen  Peds  Hematology negative hematology ROS (+)   Anesthesia Other Findings   Reproductive/Obstetrics (+) Pregnancy                             Lab Results  Component Value Date   WBC 9.3 05/01/2016   HGB 11.9 (L) 05/01/2016   HCT 34.8 (L) 05/01/2016   MCV 90.3 05/01/2016   PLT 197 05/03/2016    Anesthesia Physical Anesthesia Plan  ASA: II  Anesthesia Plan: Epidural   Post-op Pain Management:    Induction:   Airway Management Planned:   Additional Equipment:   Intra-op Plan:   Post-operative Plan:   Informed Consent:   Plan Discussed with:   Anesthesia Plan Comments:         Anesthesia Quick Evaluation

## 2016-05-03 NOTE — Progress Notes (Signed)
Intrapartum progress note   S: patient feeling contractions mostly in her back, finds relief on all fours.  No LOF VB +FM    O: 106/72 98.1 56 16 FHT 145 mod + accels no decels TOCO - tracing inconsistent with patient report, about every 5-44mins SVE: 3/70/-1 - AROM for clear fluid  A/P: 21yo G1P0 with IOL for IUGR 1. IOL: making change now with AROM, continue expectant management after cervical ripening.  If cervical change does not happen spontaneously in the next few hours will start pitocin. 2. IUP: category 1  3. Continue active management 4. Anticipate SVD  ----- Ranae Plumber, MD Attending Obstetrician and Gynecologist [facility], Department of OB/GYN [facility]

## 2016-05-03 NOTE — Clinical Social Work Maternal (Signed)
  CLINICAL SOCIAL WORK MATERNAL/CHILD NOTE  Patient Details  Name: Rose Porter MRN: [id number] Date of Birth: [birth year]  Date:  05/03/2016  Clinical Social Worker Initiating Note:  Santiago Bumpers, MSW, [place] Date/ Time Initiated:  05/03/16/      Child's Name:  Rose Porter   Legal Guardian:  Mother   Need for Interpreter:  None   Date of Referral:  05/03/16     Reason for Referral:  Current Substance Use/Substance Use During Pregnancy    Referral Source:  RN   Address:  [address], [place], [place] [address]  Phone number:  [phone]   Household Members:  Relatives   Natural Supports (not living in the home):  [place], [place], Extended Family, Friends, Immediate Family, Education administrator, Artist Supports:     Employment:     Type of Work:     Education:  Database administrator Resources:  Medicaid   Other Resources:  Physicist, medical , [place] Considerations Which May Impact Care:  None reported  Strengths:  Ability to meet basic needs , Compliance with medical plan , Home prepared for child , Engineer, materials , Understanding of illness   Risk Factors/Current Problems:  Substance Use  (Cannabis, stage of change: pre-contemplation)   Cognitive State:  Alert , Goal Oriented    Mood/Affect:  Bright , Interested , Happy , Relaxed    CSW Assessment: CSW met with patient at bedside to discuss her positive UDS for cannabinoids. The CSW asked her visitors to leave the room during the discussion, and they were all willing to do so. The patient was appropriately bonding with her son during the discussion. The patient reports that she plans to bottle feed, and that she has all needs for the child met, including WIC, Medicaid, and a car seat. The patient lives with her grandmother who is supportive, and she has support from the father of the baby and her immediate and extended family.  The CSW explained the process for mandated  reporting if the child's UDS or cord blood is positive for any substances. The patient verbalized understanding and confirmed her contact information. CSW offered resources for substance use services, and the patient politely declined.  CSW will con't to follow pending toxicology results for the child.  CSW Plan/Description:  Information/Referral to [employer] , Patient/Family Education     Rose Porter, [place] 05/03/2016, 3:23 PM

## 2016-05-04 LAB — CBC
HCT: 29.2 % — ABNORMAL LOW (ref 35.0–47.0)
HEMOGLOBIN: 9.9 g/dL — AB (ref 12.0–16.0)
MCH: 31.1 pg (ref 26.0–34.0)
MCHC: 33.9 g/dL (ref 32.0–36.0)
MCV: 91.7 fL (ref 80.0–100.0)
PLATELETS: 167 10*3/uL (ref 150–440)
RBC: 3.18 MIL/uL — ABNORMAL LOW (ref 3.80–5.20)
RDW: 12.3 % (ref 11.5–14.5)
WBC: 16.6 10*3/uL — ABNORMAL HIGH (ref 3.6–11.0)

## 2016-05-04 MED ORDER — VITAMIN C 500 MG PO TABS
250.0000 mg | ORAL_TABLET | Freq: Two times a day (BID) | ORAL | Status: DC
  Administered 2016-05-04: 250 mg via ORAL
  Filled 2016-05-04: qty 1

## 2016-05-04 MED ORDER — IBUPROFEN 800 MG PO TABS: 800.0000 mg | ORAL_TABLET | Freq: Three times a day (TID) | ORAL | 0 refills | Status: AC | PRN

## 2016-05-04 MED ORDER — MEDROXYPROGESTERONE ACETATE 150 MG/ML IM SUSP
150.0000 mg | INTRAMUSCULAR | Status: DC
  Administered 2016-05-04: 150 mg via INTRAMUSCULAR
  Filled 2016-05-04: qty 1

## 2016-05-04 MED ORDER — ASCORBIC ACID 250 MG PO TABS: 250.0000 mg | ORAL_TABLET | Freq: Two times a day (BID) | ORAL | 3 refills | Status: AC

## 2016-05-04 MED ORDER — FERROUS SULFATE 325 (65 FE) MG PO TABS
325.0000 mg | ORAL_TABLET | Freq: Two times a day (BID) | ORAL | Status: DC
  Administered 2016-05-04: 325 mg via ORAL
  Filled 2016-05-04: qty 1

## 2016-05-04 MED ORDER — FERROUS SULFATE 325 (65 FE) MG PO TABS: 325.0000 mg | ORAL_TABLET | Freq: Two times a day (BID) | ORAL | 3 refills | Status: DC

## 2016-05-04 NOTE — Anesthesia Postprocedure Evaluation (Signed)
Anesthesia Post Note  Patient: Rose Porter  Procedure(s) Performed: * No procedures listed *  Patient location during evaluation: Mother Baby Anesthesia Type: Epidural Level of consciousness: awake and alert Pain management: pain level controlled Vital Signs Assessment: post-procedure vital signs reviewed and stable Respiratory status: spontaneous breathing, nonlabored ventilation and respiratory function stable Cardiovascular status: stable Postop Assessment: no headache, no backache and epidural receding Anesthetic complications: no     Last Vitals:  Vitals:   05/04/16 0402 05/04/16 0738  BP: 99/77 96/62  Pulse: 70 (!) 52  Resp: 20 17  Temp: 36.7 C 36.6 C    Last Pain:  Vitals:   05/04/16 0900  TempSrc:   PainSc: 0-No pain                 Carolena Fairbank S
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
## 2016-05-04 NOTE — Progress Notes (Signed)
Discharge instructions complete and prescriptions given. Patient verbalizes understanding of teaching. Patient discharged home at 1235.

## 2016-05-04 NOTE — Progress Notes (Signed)
Reminded patient not to sleep with baby in the bed. Placed baby in bassinet.

## 2016-05-06 LAB — SURGICAL PATHOLOGY

## 2016-12-11 LAB — HM HIV SCREENING LAB: HM HIV Screening: NEGATIVE

## 2017-08-19 LAB — HM PAP SMEAR

## 2018-09-02 ENCOUNTER — Ambulatory Visit (LOCAL_COMMUNITY_HEALTH_CENTER): Payer: Medicaid Other | Admitting: Nurse Practitioner

## 2018-09-02 ENCOUNTER — Encounter: Payer: Self-pay | Admitting: Nurse Practitioner

## 2018-09-02 ENCOUNTER — Other Ambulatory Visit: Payer: Self-pay

## 2018-09-02 VITALS — BP 107/70 | Ht 61.0 in | Wt 108.6 lb

## 2018-09-02 DIAGNOSIS — Z3009 Encounter for other general counseling and advice on contraception: Secondary | ICD-10-CM | POA: Diagnosis not present

## 2018-09-02 DIAGNOSIS — Z3042 Encounter for surveillance of injectable contraceptive: Secondary | ICD-10-CM

## 2018-09-02 DIAGNOSIS — N926 Irregular menstruation, unspecified: Secondary | ICD-10-CM

## 2018-09-02 DIAGNOSIS — Z30013 Encounter for initial prescription of injectable contraceptive: Secondary | ICD-10-CM | POA: Diagnosis not present

## 2018-09-02 LAB — WET PREP FOR TRICH, YEAST, CLUE
Trichomonas Exam: NEGATIVE
Yeast Exam: NEGATIVE

## 2018-09-02 MED ORDER — IBUPROFEN 600 MG PO TABS: 600.0000 mg | ORAL_TABLET | Freq: Three times a day (TID) | ORAL | 1 refills | Status: DC | PRN

## 2018-09-02 MED ORDER — MEDROXYPROGESTERONE ACETATE 150 MG/ML IM SUSP
150.0000 mg | INTRAMUSCULAR | Status: DC
  Administered 2018-09-02: 150 mg via INTRAMUSCULAR

## 2018-09-02 NOTE — Progress Notes (Addendum)
Depo given right deltoid, tolerated well, next Depo card given.Jenetta Downer, RN  Wet mount reviewed, no treatment indicated at this time.Jenetta Downer, RN

## 2018-09-02 NOTE — Progress Notes (Signed)
Patient here for Depo at 11 3/7 since last Depo on 06/14/2018. Last PE was in 07/2017.Marland KitchenJenetta Downer, RN

## 2018-09-02 NOTE — Progress Notes (Signed)
    [facility] problem visit  [place] Department  Subjective:  Rose Porter is a 23 y.o. being seen today for   Chief Complaint  Patient presents with  . Contraception    Depo    Client into clinic requesting to continue with DMPA - with injection in arm Need order for next 2 cycles of DMPA and then will receive PE at that time. C/O heavy bleeding after receiving 1st DMPA injection 11 wk's ago - resolved to light brownish discharge. Denies any additional significant medical history     Does the patient have a current or past history of drug use? No   No components found for: HCV]   Health Maintenance Due  Topic Date Due  . TETANUS/TDAP  03/30/2012  . PAP-Cervical Cytology Screening  03/31/2014  . PAP SMEAR-Modifier  03/31/2014  . INFLUENZA VACCINE  08/28/2018    Review of Systems  All other systems reviewed and are negative.   The following portions of the patient's history were reviewed and updated as appropriate: allergies, current medications, past family history, past medical history, past social history, past surgical history and problem list. Problem list updated.   See flowsheet for other program required questions.  Objective:   Vitals:   09/02/18 1003  BP: 107/70  Weight: 108 lb 9.6 oz (49.3 kg)  Height: 5\' 1"  (1.549 m)    Physical Exam Vitals signs reviewed.  Constitutional:      Appearance: Normal appearance. She is well-developed and normal weight.  Pulmonary:     Effort: Pulmonary effort is normal.  Skin:    General: Skin is warm and dry.  Neurological:     Mental Status: She is alert.  Psychiatric:        Behavior: Behavior is cooperative.       Assessment and Plan:  Rose Porter is a 23 y.o. female presenting to the [facility] Department for a Women's Health problem visit  1. Encounter for Depo-Provera contraception Desires DMPA injection in arm - medroxyPROGESTERone  (DEPO-PROVERA) injection 150 mg  2. Irregular bleeding Advised client to try the follow if irregular bleeding begins - ibuprofen (ADVIL) 600 MG tablet; Take 1 tablet (600 mg total) by mouth every 8 (eight) hours as needed (x 5 day if irregular bleeding).  Dispense: 30 tablet; Refill: 1 3.  Await results - WET PREP FOR [place], YEAST, CLUE - self collected - treat wet mount per standing order Client verbalizes understanding and is in agreement with plan of care  Return in about 3 months (around 12/03/2018) for 11-13 weeks for DMPA, Yearly physical exam, pap as well.  No future appointments.  Berniece Andreas, NP

## 2018-09-16 ENCOUNTER — Telehealth: Payer: Self-pay

## 2018-09-16 NOTE — Telephone Encounter (Signed)
Pap letter mailed. Instructed when scheduling next Depo also schedule for RP/pap. Aileen Fass, RN

## 2018-11-24 ENCOUNTER — Other Ambulatory Visit: Payer: Self-pay

## 2018-11-24 ENCOUNTER — Ambulatory Visit (LOCAL_COMMUNITY_HEALTH_CENTER): Payer: Medicaid Other

## 2018-11-24 VITALS — BP 105/67 | Ht 61.5 in | Wt 111.0 lb

## 2018-11-24 DIAGNOSIS — Z3009 Encounter for other general counseling and advice on contraception: Secondary | ICD-10-CM

## 2018-11-24 DIAGNOSIS — Z30013 Encounter for initial prescription of injectable contraceptive: Secondary | ICD-10-CM | POA: Diagnosis not present

## 2018-11-24 MED ORDER — MEDROXYPROGESTERONE ACETATE 150 MG/ML IM SUSP
150.0000 mg | Freq: Once | INTRAMUSCULAR | Status: AC
  Administered 2018-11-24: 150 mg via INTRAMUSCULAR

## 2018-11-24 NOTE — Progress Notes (Signed)
Pt here for Depo. Pt reports no issues with Depo and is satisfied with Depo as BCM. Pt's last physical was 08/17/2017. Last Depo was 09/02/2018 so pt is 11 weeks and 6 days post last Depo. Consulted with Antoine Primas, PA and per Antoine Primas, PA verbal order ok for pt to have Depo 150mg  IM once today then in 11-13 weeks return for a women's health revisit if we are still not doing physicals at the time so that pt history can be updated and if pap or CBE is due will be able to get those updated. Pt received Depo 150mg  IM today per Antoine Primas, PA verbal order. Pt tolerated well. Counseled pt per Antoine Primas, PA verbal orders and pt states understanding.Ronny Bacon, RN

## 2018-11-24 NOTE — Progress Notes (Signed)
Consulted by RN re: giving Depo today.  Reviewed RN note and agree with consultation as documented.

## 2019-02-16 ENCOUNTER — Other Ambulatory Visit: Payer: Self-pay | Admitting: Physician Assistant

## 2019-02-16 DIAGNOSIS — Z3042 Encounter for surveillance of injectable contraceptive: Secondary | ICD-10-CM

## 2019-02-16 MED ORDER — MEDROXYPROGESTERONE ACETATE 150 MG/ML IM SUSP
150.0000 mg | Freq: Once | INTRAMUSCULAR | Status: AC
  Administered 2019-02-25: 150 mg via INTRAMUSCULAR

## 2019-02-17 ENCOUNTER — Ambulatory Visit: Payer: Medicaid Other

## 2019-02-25 ENCOUNTER — Other Ambulatory Visit: Payer: Self-pay

## 2019-02-25 ENCOUNTER — Ambulatory Visit (LOCAL_COMMUNITY_HEALTH_CENTER): Payer: Medicaid Other

## 2019-02-25 VITALS — BP 108/71 | Ht 62.0 in | Wt 114.0 lb

## 2019-02-25 DIAGNOSIS — Z30013 Encounter for initial prescription of injectable contraceptive: Secondary | ICD-10-CM | POA: Diagnosis not present

## 2019-02-25 DIAGNOSIS — Z3009 Encounter for other general counseling and advice on contraception: Secondary | ICD-10-CM | POA: Diagnosis not present

## 2019-02-25 DIAGNOSIS — Z3042 Encounter for surveillance of injectable contraceptive: Secondary | ICD-10-CM

## 2019-02-25 NOTE — Progress Notes (Signed)
Depo given per [place], [place] order.  Tolerated well Richmond Campbell, RN

## 2019-05-16 ENCOUNTER — Ambulatory Visit: Payer: Medicaid Other

## 2020-08-28 ENCOUNTER — Encounter: Payer: Self-pay | Admitting: Family Medicine

## 2020-08-28 ENCOUNTER — Ambulatory Visit (LOCAL_COMMUNITY_HEALTH_CENTER): Payer: Medicaid Other | Admitting: Family Medicine

## 2020-08-28 ENCOUNTER — Other Ambulatory Visit: Payer: Self-pay

## 2020-08-28 VITALS — BP 101/73 | Ht 62.0 in | Wt 101.2 lb

## 2020-08-28 DIAGNOSIS — Z5321 Procedure and treatment not carried out due to patient leaving prior to being seen by health care provider: Secondary | ICD-10-CM

## 2020-08-28 NOTE — Progress Notes (Signed)
Pt left before  being seen by provider   Wendi Snipes, FNP

## 2020-08-28 NOTE — Progress Notes (Addendum)
Here today for PE, STD check and birth control. Last PE and Pap Smear here was 08/17/2017 (NIL.) Last Depo was 2021. Accepts all STD screening today but states to RN "can I just get my shot today and come back for a physical? RN explained to patient that she will still need to be seen by a provider. PHQ9 completed in Epic. Tawny Hopping, RN

## 2020-09-03 ENCOUNTER — Ambulatory Visit: Payer: Medicaid Other

## 2020-09-05 ENCOUNTER — Ambulatory Visit: Payer: Medicaid Other

## 2020-09-11 ENCOUNTER — Ambulatory Visit: Payer: Medicaid Other

## 2022-08-05 ENCOUNTER — Ambulatory Visit: Payer: Self-pay

## 2022-08-06 ENCOUNTER — Telehealth: Payer: Self-pay | Admitting: Nurse Practitioner

## 2022-08-06 DIAGNOSIS — N898 Other specified noninflammatory disorders of vagina: Secondary | ICD-10-CM
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
## 2022-08-20 ENCOUNTER — Ambulatory Visit: Payer: Self-pay

## 2023-11-24 ENCOUNTER — Ambulatory Visit

## 2023-11-25 ENCOUNTER — Ambulatory Visit

## 2023-11-25 DIAGNOSIS — Z202 Contact with and (suspected) exposure to infections with a predominantly sexual mode of transmission: Secondary | ICD-10-CM

## 2023-11-25 DIAGNOSIS — A599 Trichomoniasis, unspecified: Secondary | ICD-10-CM

## 2023-11-25 DIAGNOSIS — Z113 Encounter for screening for infections with a predominantly sexual mode of transmission: Secondary | ICD-10-CM

## 2023-11-25 LAB — WET PREP FOR TRICH, YEAST, CLUE
Clue Cell Exam: NEGATIVE
Trichomonas Exam: POSITIVE — AB
Yeast Exam: NEGATIVE

## 2023-11-25 MED ORDER — CEFTRIAXONE SODIUM 500 MG IJ SOLR
500.0000 mg | Freq: Once | INTRAMUSCULAR | Status: AC
  Administered 2023-11-25: 500 mg via INTRAMUSCULAR

## 2023-11-25 MED ORDER — METRONIDAZOLE 500 MG PO TABS: 500.0000 mg | ORAL_TABLET | Freq: Two times a day (BID) | ORAL | Status: AC

## 2023-11-25 NOTE — Progress Notes (Signed)
 [facility] Department STI clinic [address] [place] [address] Main phone: [phone]  STI screening visit  Subjective:  Rose Porter is a 28 y.o. female being seen today for an STI screening visit. The patient reports they do not have symptoms. They are a contact to gonorrhea.   Patient's last menstrual period was 11/15/2023 (exact date).  Patient has the following medical conditions:  There are no active problems to display for this patient.  Chief Complaint  Patient presents with   SEXUALLY TRANSMITTED DISEASE   HPI Patient reports possible exposure to gonorrhea. Exposed about 30 days ago, person notified her that he tested positive for gonorrhea. No sex since that encounter. No symptoms.  Does the patient using douching products? No  See flowsheet for further details and programmatic requirements Hyperlink available at the top of the signed note in blue.  Flow sheet content below:  Pregnancy Intention Screening Does the patient want to become pregnant in the next year?: No Does the patient's partner want to become pregnant in the next year?: No Would the patient like to discuss contraceptive options today?: No Reason For STD Screen STD Screening: Is asymptomatic, Is a contact Have you ever had an STD?: Yes History of Antibiotic use in the past 2 weeks?: No STD Symptoms Denies all: Yes Advise Advised client to quit or stay quit. : Yes Counseling Patient counseled to use condoms with all sex: Condoms given RTC in 2-3 weeks for test results: Yes Clinic will call if test results abnormal before test result appt.: Yes Test results given to patient Patient counseled to use condoms with all sex: Condoms given   Screening for MPX risk:  Unexplained rash?  No   MSM?  No   Multiple or anonymous sex partners?  No   Any close or sexual contact with a person  diagnosed with MPX?  No   Any outside the [place]  where MPX is endemic?   No   High clinical suspicion for MPX?    -Unlikely to be chickenpox    -Lymphadenopathy    -Rash that presents in same phase of       evolution on any given body part  No   Screenings: Last HIV test per patient/review of record was  Lab Results  Component Value Date   HMHIVSCREEN Negative - Validated 12/11/2016    Lab Results  Component Value Date   HIV Non-reactive 10/04/2015    Last HEPC test per patient/review of record was No results found for: HMHEPCSCREEN No components found for: HEPC   Last HEPB test per patient/review of record was No components found for: HMHEPBSCREEN   Patient reports last pap was:   No results found for: SPECADGYN Result Date Procedure Results Follow-ups  08/19/2017 HM PAP SMEAR HM Pap smear: NIL    Immunization history:  Immunization History  Administered Date(s) Administered   HPV Quadrivalent 05/12/2008, 04/11/2010, 04/17/2011, 06/19/2011   Hepatitis A, Ped/Adol-2 Dose 04/17/2011   Hepatitis B, PED/ADOLESCENT 08/31/93, 06/04/1993, 10/04/1993   MMR 07/09/1994, 04/03/1997   Tdap 02/21/2016   Varicella 11/19/1994, 07/08/2007    The following portions of the patient's history were reviewed and updated as appropriate: allergies, current medications, past medical history, past social history, past surgical history and problem list.  Objective:  There were no vitals filed for this visit.  Physical Exam Vitals and nursing note reviewed. Chaperone present: Ronnald Orange.  Constitutional:      Appearance: Normal appearance.  HENT:  Head: Normocephalic and atraumatic.     Mouth/Throat:     Mouth: Mucous membranes are moist.     Pharynx: Oropharynx is clear. No oropharyngeal exudate or posterior oropharyngeal erythema.  Pulmonary:     Effort: Pulmonary effort is normal.  Abdominal:     General: Abdomen is flat.  Genitourinary:    General: Normal vulva.     Exam position: Lithotomy position.     Pubic Area: No rash or pubic lice.       Labia:        Right: No rash or lesion.        Left: No rash or lesion.      Vagina: Normal. No vaginal discharge, erythema, bleeding or lesions.     Cervix: No cervical motion tenderness, discharge, friability, lesion or erythema.     Comments: pH = <4.5 Lymphadenopathy:     Head:     Right side of head: No preauricular or posterior auricular adenopathy.     Left side of head: No preauricular or posterior auricular adenopathy.     Cervical: No cervical adenopathy.     Upper Body:     Right upper body: No supraclavicular adenopathy.     Left upper body: No supraclavicular adenopathy.  Skin:    General: Skin is warm and dry.     Findings: No rash.  Neurological:     Mental Status: She is alert and oriented to person, place, and time.    Assessment and Plan:  Rose Porter is a 28 y.o. female presenting to the [facility] Department for STI screening  1. Screening for venereal disease (Primary)  - Gonococcus culture - WET PREP FOR TRICH, YEAST, CLUE - Chlamydia/Gonorrhea [facility]  2. Gonorrhea contact  - cefTRIAXone (ROCEPHIN) injection 500 mg   3. Trichomonas infection  - metroNIDAZOLE (FLAGYL) 500 MG tablet; Take 1 tablet (500 mg total) by mouth 2 (two) times daily for 7 days.   Patient accepted the following screenings: oral GC culture, vaginal CT/GC swab, and vaginal wet prep Declines all blood tests.   Treat wet prep per standing order Discussed time line for [facility] results and that patient will be called with positive results and encouraged patient to call if she had not heard in 2 weeks.  Counseled to return or seek care for continued or worsening symptoms Recommended repeat testing in 3 months with positive results. Recommended condom use with all sex for STI prevention.   Return in about 3 months (around 02/25/2024).  No future appointments.  Damien FORBES Satchel, NP

## 2023-11-25 NOTE — Progress Notes (Signed)
 Pt is here for STD screening and a contact to Gonorrhea. Wet prep results reviewed with patient and patient was dispensed Metronidazole 500 mg tablet 2x/day for 7 days. Ceftriaxone 500 mg IM injection given at the [facility]  and patient tolerated well to injection. I provided counseling today regarding the medication, the side effects and when to call clinic. Patient was given the opportunity to ask questions for any clarifications. Brochure and condoms given. Wilkie Drought, RN.

## 2023-12-01 LAB — GONOCOCCUS CULTURE
